# Patient Record
Sex: Female | Born: 1985 | Race: White | Hispanic: No | Marital: Married | Smoking: Never smoker
Health system: Southern US, Community
[De-identification: ages and names within clinical notes are randomized; demographics above are authoritative.]

## PROBLEM LIST (undated history)

## (undated) ENCOUNTER — Inpatient Hospital Stay (HOSPITAL_COMMUNITY): Payer: Self-pay

## (undated) DIAGNOSIS — I10 Essential (primary) hypertension: Secondary | ICD-10-CM

## (undated) HISTORY — PX: TONSILLECTOMY: SUR1361

---
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
## 2005-12-11 ENCOUNTER — Other Ambulatory Visit: Admission: RE | Admit: 2005-12-11 | Discharge: 2005-12-11 | Payer: Self-pay | Admitting: Family Medicine

## 2008-01-01 ENCOUNTER — Emergency Department (HOSPITAL_COMMUNITY): Admission: EM | Admit: 2008-01-01 | Discharge: 2008-01-01 | Payer: Self-pay | Admitting: Emergency Medicine

## 2008-11-24 ENCOUNTER — Inpatient Hospital Stay (HOSPITAL_COMMUNITY): Admission: AD | Admit: 2008-11-24 | Discharge: 2008-11-24 | Payer: Self-pay | Admitting: Obstetrics & Gynecology

## 2008-12-20 ENCOUNTER — Other Ambulatory Visit: Admission: RE | Admit: 2008-12-20 | Discharge: 2008-12-20 | Payer: Self-pay | Admitting: Obstetrics and Gynecology

## 2009-02-22 ENCOUNTER — Ambulatory Visit (HOSPITAL_COMMUNITY): Admission: RE | Admit: 2009-02-22 | Discharge: 2009-02-22 | Payer: Self-pay | Admitting: Obstetrics & Gynecology

## 2009-04-05 ENCOUNTER — Ambulatory Visit (HOSPITAL_COMMUNITY): Admission: RE | Admit: 2009-04-05 | Discharge: 2009-04-05 | Payer: Self-pay | Admitting: Obstetrics & Gynecology

## 2009-06-30 ENCOUNTER — Inpatient Hospital Stay (HOSPITAL_COMMUNITY): Admission: AD | Admit: 2009-06-30 | Discharge: 2009-06-30 | Payer: Self-pay | Admitting: Obstetrics & Gynecology

## 2009-07-06 ENCOUNTER — Ambulatory Visit: Payer: Self-pay | Admitting: Advanced Practice Midwife

## 2009-07-06 ENCOUNTER — Inpatient Hospital Stay (HOSPITAL_COMMUNITY): Admission: AD | Admit: 2009-07-06 | Discharge: 2009-07-06 | Payer: Self-pay | Admitting: Obstetrics and Gynecology

## 2009-07-15 ENCOUNTER — Inpatient Hospital Stay (HOSPITAL_COMMUNITY): Admission: AD | Admit: 2009-07-15 | Discharge: 2009-07-17 | Payer: Self-pay | Admitting: Obstetrics & Gynecology

## 2009-07-15 ENCOUNTER — Ambulatory Visit: Payer: Self-pay | Admitting: Obstetrics and Gynecology

## 2009-09-14 IMAGING — US US OB COMP LESS 14 WK
1 series · 14 of 17 positions shown · non-contrast
Comparison: None

CLINICAL DATA: 7 weeks pregnant and bleeding

OBSTETRIC <14 WK US AND TRANSVAGINAL OB US
TECHNIQUE: Both transabdominal and transvaginal ultrasound
examinations were performed for complete evaluation of the
gestation as well as the maternal uterus, adnexal regions, and
pelvic cul-de-sac.

[Series 1: us ob comp less 14 wks · 17 acquisitions, 14 frames shown]
[im 1/17]
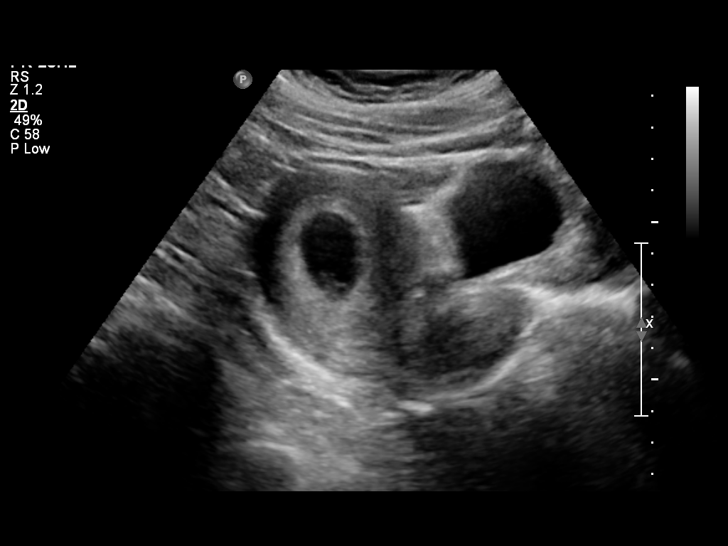
[im 2/17]
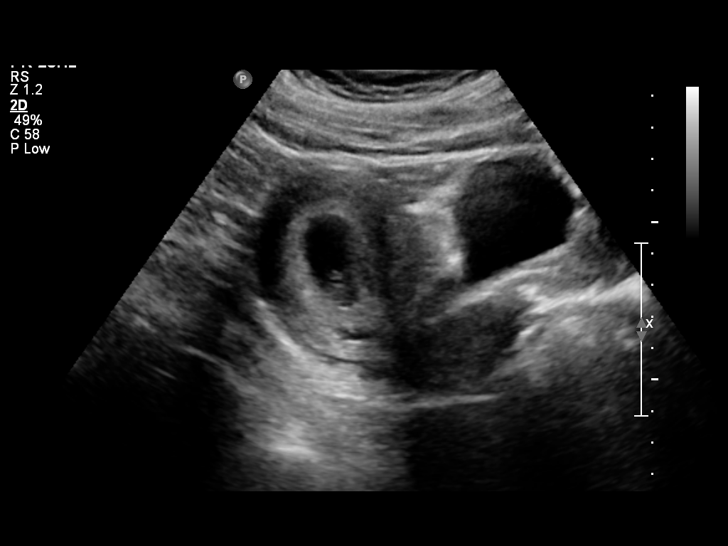
[im 4/17]
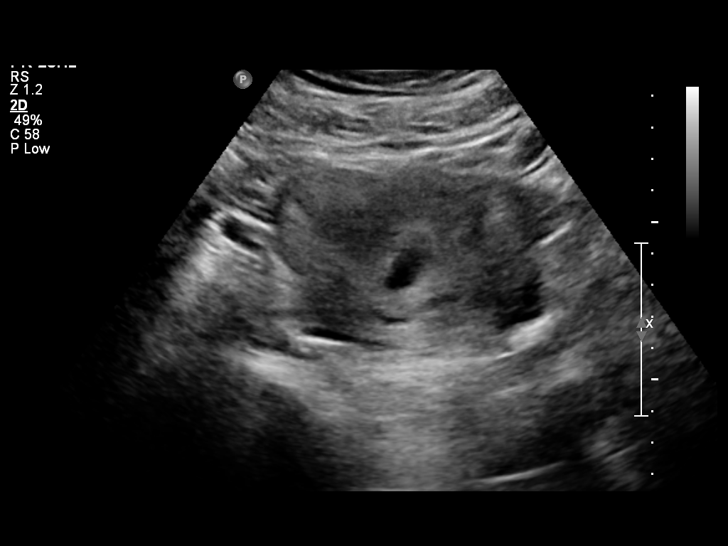
[im 5/17]
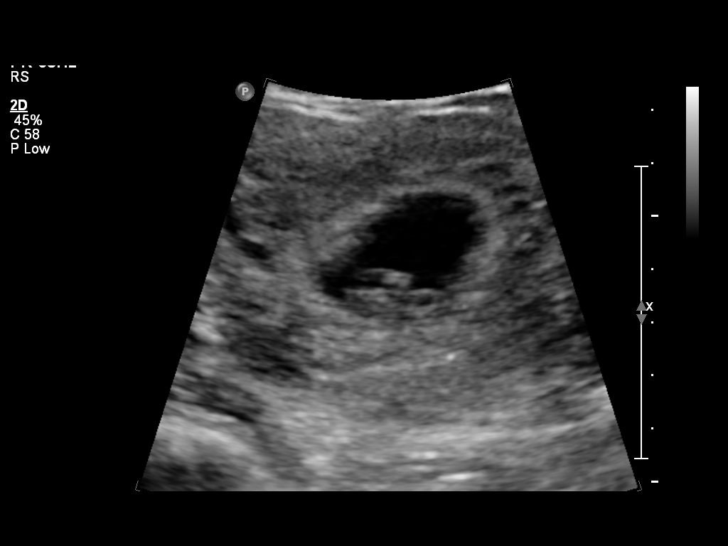
[im 6/17]
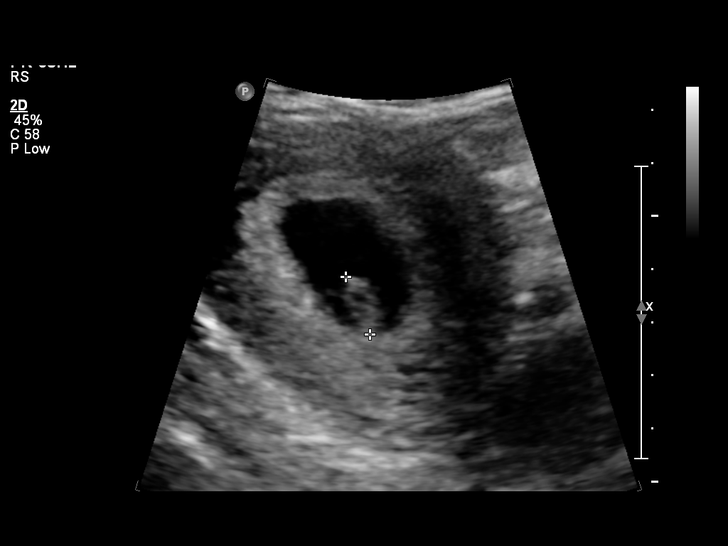
[im 7/17]
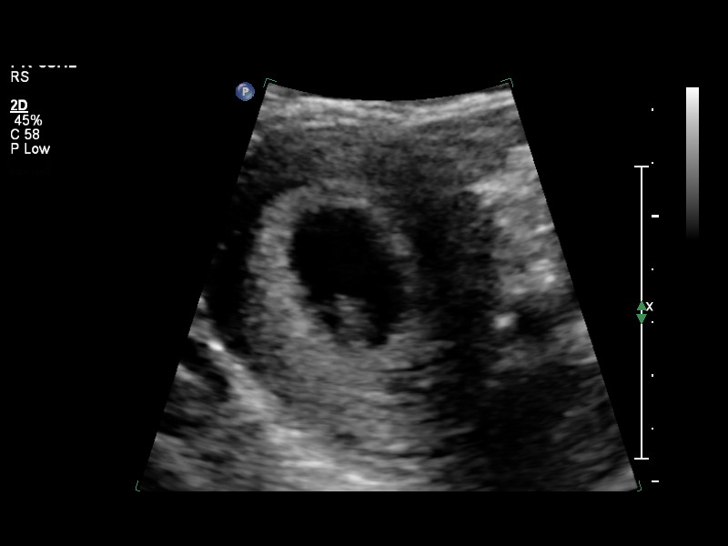
[im 8/17]
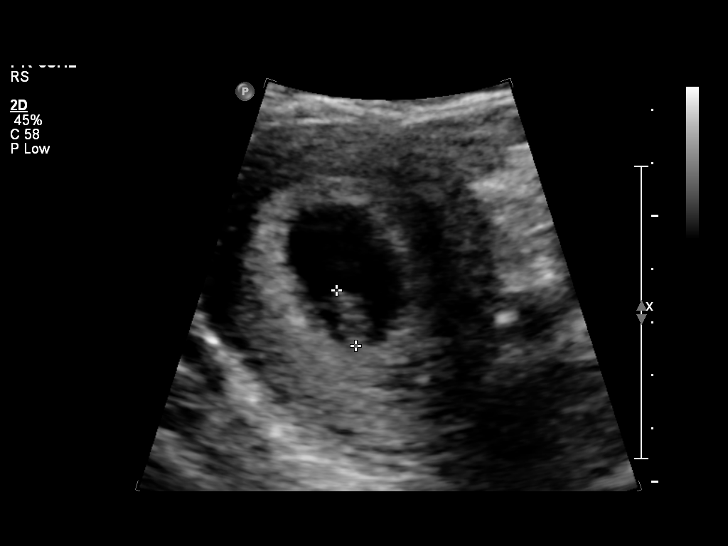
[im 10/17]
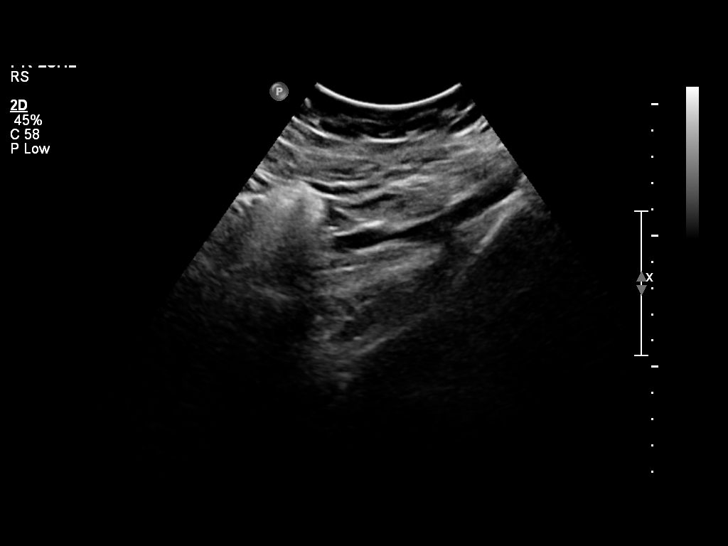
[im 11/17]
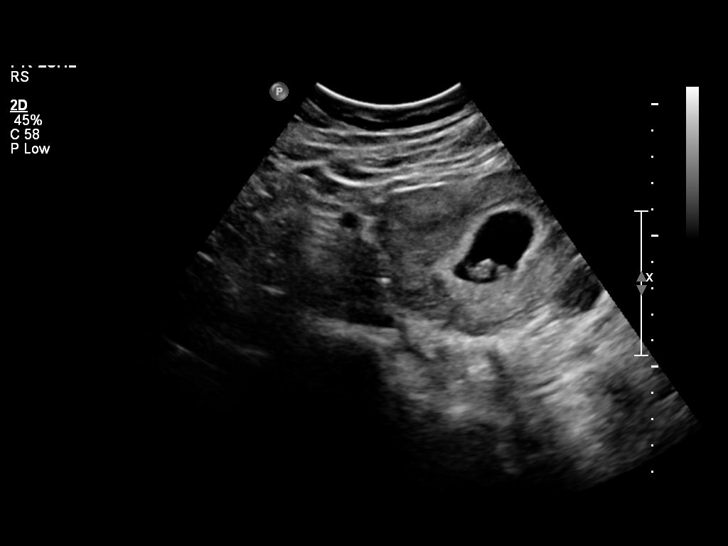
[im 12/17]
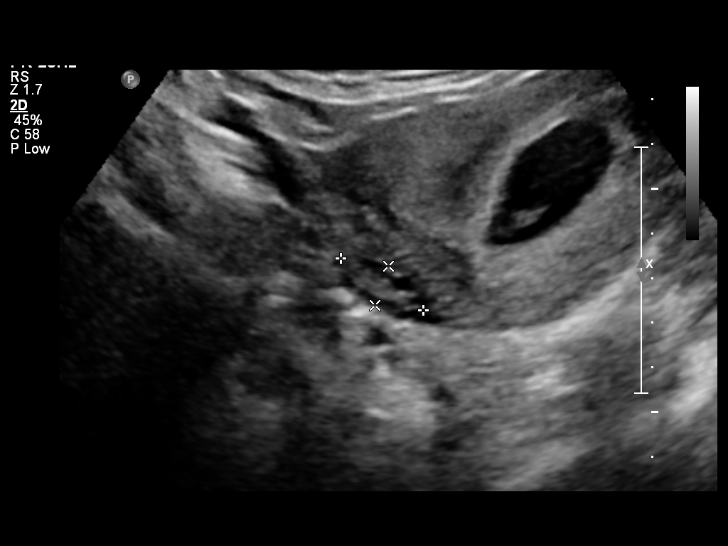
[im 13/17]
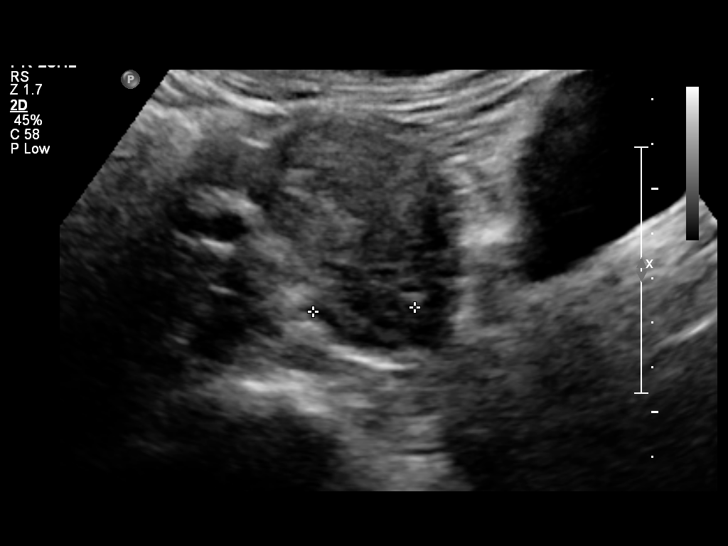
[im 14/17]
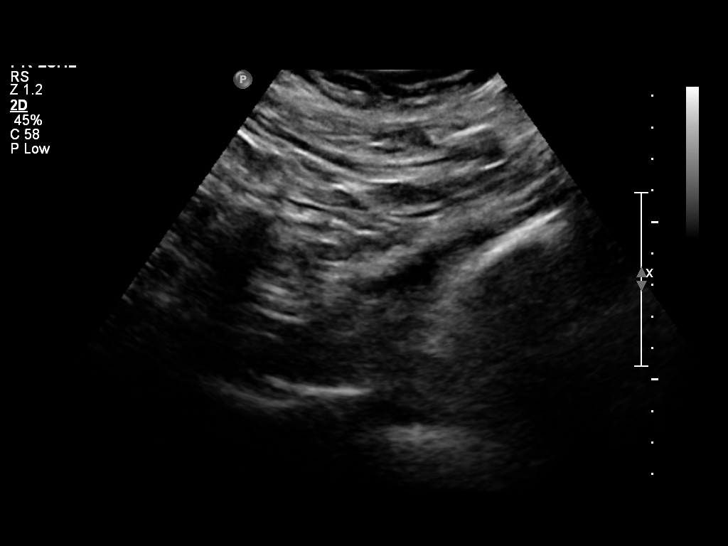
[im 16/17]
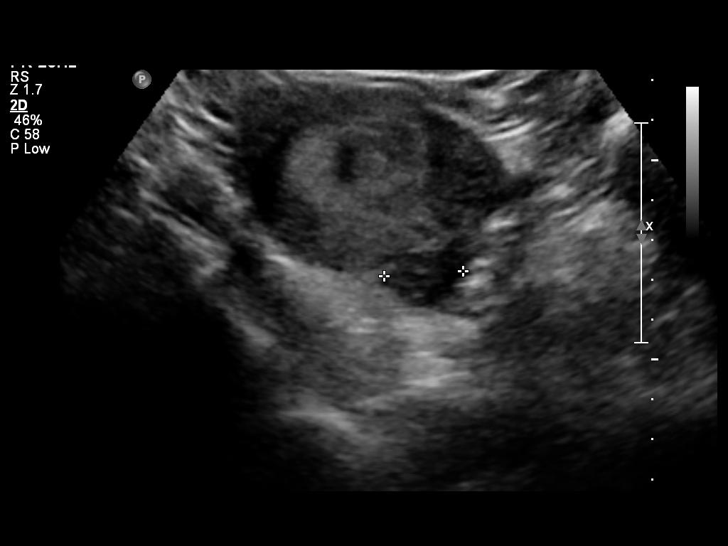
[im 17/17]
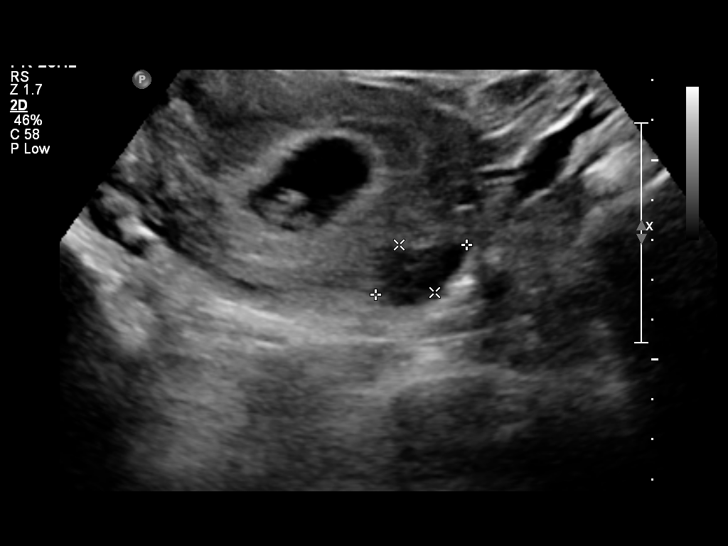

[14 of 17 positions shown; findings below may reference images not displayed]

Intrauterine gestational sac: Present
Yolk sac: Present
Embryo: Present
Cardiac Activity: Present
Heart Rate: 147 bpm

CRL: 11.5 mm           seven   w  three   d           US EDC:

Maternal uterus/adnexae:
Ovaries are within normal limits.  Small subchorionic hemorrhages
present.
IMPRESSION: Live intrauterine pregnancy with an estimated gestational age of 7
weeks and 3 days.  Small subchorionic hemorrhage.  Fetal heart rate
is 147 beats per minute.

## 2010-11-11 LAB — CBC
HCT: 38.2 % (ref 36.0–46.0)
Hemoglobin: 12.9 g/dL (ref 12.0–15.0)
MCHC: 33.8 g/dL (ref 30.0–36.0)
MCV: 90.6 fL (ref 78.0–100.0)
Platelets: 275 10*3/uL (ref 150–400)
RBC: 4.22 MIL/uL (ref 3.87–5.11)
RDW: 13.4 % (ref 11.5–15.5)
WBC: 18.6 10*3/uL — ABNORMAL HIGH (ref 4.0–10.5)

## 2010-11-11 LAB — RPR: RPR Ser Ql: NONREACTIVE

## 2010-11-12 LAB — WET PREP, GENITAL
Clue Cells Wet Prep HPF POC: NONE SEEN
Trich, Wet Prep: NONE SEEN
Yeast Wet Prep HPF POC: NONE SEEN

## 2010-11-19 LAB — CBC
HCT: 34.9 % — ABNORMAL LOW (ref 36.0–46.0)
Hemoglobin: 12.1 g/dL (ref 12.0–15.0)
MCHC: 34.6 g/dL (ref 30.0–36.0)
MCV: 91.8 fL (ref 78.0–100.0)
Platelets: 221 10*3/uL (ref 150–400)
RBC: 3.8 MIL/uL — ABNORMAL LOW (ref 3.87–5.11)
RDW: 13.1 % (ref 11.5–15.5)
WBC: 6.2 10*3/uL (ref 4.0–10.5)

## 2010-11-19 LAB — GC/CHLAMYDIA PROBE AMP, GENITAL
Chlamydia, DNA Probe: NEGATIVE
GC Probe Amp, Genital: NEGATIVE

## 2010-11-19 LAB — SAMPLE TO BLOOD BANK

## 2010-11-19 LAB — WET PREP, GENITAL
Clue Cells Wet Prep HPF POC: NONE SEEN
Trich, Wet Prep: NONE SEEN
Yeast Wet Prep HPF POC: NONE SEEN

## 2010-11-19 LAB — HCG, QUANTITATIVE, PREGNANCY: hCG, Beta Chain, Quant, S: 96209 m[IU]/mL — ABNORMAL HIGH (ref <5)

## 2011-05-06 LAB — URINALYSIS, ROUTINE W REFLEX MICROSCOPIC
Bilirubin Urine: NEGATIVE
Glucose, UA: NEGATIVE
Ketones, ur: NEGATIVE
Nitrite: NEGATIVE
Protein, ur: 30 — AB
Specific Gravity, Urine: 1.02
Urobilinogen, UA: 4 — ABNORMAL HIGH
pH: 6.5

## 2011-05-06 LAB — URINE MICROSCOPIC-ADD ON

## 2011-05-06 LAB — PREGNANCY, URINE: Preg Test, Ur: NEGATIVE

## 2011-12-09 ENCOUNTER — Ambulatory Visit: Payer: Self-pay | Admitting: Physical Therapy

## 2012-12-12 ENCOUNTER — Encounter: Payer: Self-pay | Admitting: *Deleted

## 2012-12-12 DIAGNOSIS — Z87442 Personal history of urinary calculi: Secondary | ICD-10-CM

## 2012-12-13 ENCOUNTER — Encounter: Payer: Self-pay | Admitting: Adult Health

## 2012-12-13 ENCOUNTER — Encounter: Payer: Self-pay | Admitting: *Deleted

## 2012-12-30 ENCOUNTER — Other Ambulatory Visit: Payer: Self-pay | Admitting: Adult Health

## 2014-06-11 ENCOUNTER — Encounter: Payer: Self-pay | Admitting: *Deleted

## 2015-10-23 ENCOUNTER — Encounter: Payer: BLUE CROSS/BLUE SHIELD | Admitting: Adult Health

## 2015-10-29 ENCOUNTER — Ambulatory Visit (INDEPENDENT_AMBULATORY_CARE_PROVIDER_SITE_OTHER): Payer: BLUE CROSS/BLUE SHIELD | Admitting: Women's Health

## 2015-10-29 ENCOUNTER — Encounter: Payer: Self-pay | Admitting: Women's Health

## 2015-10-29 VITALS — BP 130/94 | HR 80 | Wt 301.0 lb

## 2015-10-29 DIAGNOSIS — Z3046 Encounter for surveillance of implantable subdermal contraceptive: Secondary | ICD-10-CM

## 2015-10-29 DIAGNOSIS — Z3202 Encounter for pregnancy test, result negative: Secondary | ICD-10-CM

## 2015-10-29 LAB — POCT URINE PREGNANCY: Preg Test, Ur: NEGATIVE

## 2015-10-29 NOTE — Patient Instructions (Addendum)
Begin a prenatal vitamin daily  Keep the area clean and dry.  You can remove the big bandage in 24 hours, and the small steri-strip bandage in 3-5 days.

## 2015-10-29 NOTE — Addendum Note (Signed)
Addended by: Gaylyn RongEVANS, Keano Guggenheim A on: 10/29/2015 02:24 PM   Modules accepted: Orders

## 2015-10-29 NOTE — Progress Notes (Signed)
Patient ID: Mahalia LongestBrandy N Nevins, female   DOB: 06/03/1986, 30 y.o.   MRN: [id number] Mahalia LongestBrandy N Riches is a 30 y.o. year old Caucasian female here for Nexplanon removal.  Patient given informed consent for removal of her Nexplanon. Her Nexplanon was placed 09/23/09, so has been in for >2146yrs now. Is wanting to get pregnant. Denies h/o HTN. Not taking pnv- to go ahead and start. Last pap >1570yrs ago.   BP 130/94 mmHg  Pulse 80  Wt 301 lb (136.533 kg)  Appropriate time out taken. Nexplanon site identified.  Area prepped in usual sterile fashon. One cc of 2% lidocaine was used to anesthetize the area at the distal end of the implant. A small stab incision was made right beside the implant on the distal portion.  The Nexplanon rod was grasped using hemostats and removed without difficulty.  There was less than 3 cc blood loss. There were no complications.  Steri-strips were applied over the small incision and a pressure bandage was applied.  The patient tolerated the procedure well.  She was instructed to keep the area clean and dry, remove pressure bandage in 24 hours, and keep insertion site covered with the steri-strip for 3-5 days.    Follow-up PRN problems.  Marge DuncansBooker, Arvle Grabe Randall CNM, [facility] 10/29/2015 2:18 PM

## 2015-11-13 ENCOUNTER — Ambulatory Visit (INDEPENDENT_AMBULATORY_CARE_PROVIDER_SITE_OTHER): Payer: BLUE CROSS/BLUE SHIELD | Admitting: Women's Health

## 2015-11-13 ENCOUNTER — Encounter: Payer: Self-pay | Admitting: Women's Health

## 2015-11-13 ENCOUNTER — Other Ambulatory Visit (HOSPITAL_COMMUNITY)
Admission: RE | Admit: 2015-11-13 | Discharge: 2015-11-13 | Disposition: A | Discharge status: Home / Self Care | Payer: BLUE CROSS/BLUE SHIELD | Source: Ambulatory Visit | Attending: Obstetrics & Gynecology | Admitting: Obstetrics & Gynecology

## 2015-11-13 VITALS — BP 118/82 | HR 68 | Ht 62.0 in | Wt 302.0 lb

## 2015-11-13 DIAGNOSIS — Z1151 Encounter for screening for human papillomavirus (HPV): Secondary | ICD-10-CM | POA: Diagnosis present

## 2015-11-13 DIAGNOSIS — Z01419 Encounter for gynecological examination (general) (routine) without abnormal findings: Secondary | ICD-10-CM | POA: Diagnosis present

## 2015-11-13 DIAGNOSIS — N393 Stress incontinence (female) (male): Secondary | ICD-10-CM | POA: Insufficient documentation

## 2015-11-13 DIAGNOSIS — E669 Obesity, unspecified: Secondary | ICD-10-CM | POA: Insufficient documentation

## 2015-11-13 NOTE — Progress Notes (Signed)
Patient ID: Kari LongestBrandy N Doxtater, female   DOB: 07/15/1986, 30 y.o.   MRN: [id number] Subjective:   Kari Maddox is a 30 y.o. 881P1001 Caucasian female here for a routine well-woman exam.  Patient's last menstrual period was 10/16/2015.    Current complaints: loses urine w/ cough/sneeze, etc  [facility]       Does desire labs but has not been fasting and [place] in [place], will get w/ PCP  Social History: Sexual: heterosexual Marital Status: married Living situation: with family Occupation: Adult nursecustomer service rep [employer] Tobacco/alcohol: no tobacco or etoh Illicit drugs: no history of illicit drug use  The following portions of the patient's history were reviewed and updated as appropriate: allergies, current medications, past family history, past medical history, past social history, past surgical history and problem list.  Past Medical History Past Medical History  Diagnosis Date  . Kidney stones     Past Surgical History Past Surgical History  Procedure Laterality Date  . Tonsillectomy      Gynecologic History G1P1001  Patient's last menstrual period was 10/16/2015. Contraception: none, just had nexplanon removed, 'if pregnancy happens it happens' Last Pap: 2010. Results were: normal Last mammogram: never. Results were: n/a Last TCS: never  Obstetric History OB History  Gravida Para Term Preterm AB SAB TAB Ectopic Multiple Living  1 1 1       1     # Outcome Date GA Lbr Len/2nd Weight Sex Delivery Anes PTL Lv  1 Term 07/16/09 521w6d  7 lb 6 oz (3.345 kg) F Vag-Spont   Y      Current Medications Current Outpatient Prescriptions on File Prior to Visit  Medication Sig Dispense Refill  . etonogestrel (IMPLANON) 68 MG IMPL implant Inject 1 each into the skin once. Reported on 11/13/2015     No current facility-administered medications on file prior to visit.    Review of Systems Patient denies any headaches, blurred vision, shortness of breath, chest  pain, abdominal pain, problems with bowel movements, urination, or intercourse.  Objective:  BP 118/82 mmHg  Pulse 68  Ht 5\' 2"  (1.575 m)  Wt 302 lb (136.986 kg)  BMI 55.22 kg/m2  LMP 10/16/2015 Physical Exam  General:  Well developed, well nourished, no acute distress. She is alert and oriented x3. Skin:  Warm and dry Neck:  Midline trachea, no thyromegaly or nodules Cardiovascular: Regular rate and rhythm, no murmur heard Lungs:  Effort normal, all lung fields clear to auscultation bilaterally Breasts:  No dominant palpable mass, retraction, or nipple discharge Abdomen:  Soft, non tender, no hepatosplenomegaly or masses Pelvic:  External genitalia is normal in appearance.  The vagina is normal in appearance. The cervix is bulbous, no CMT.  Thin prep pap is done w/  HR HPV cotesting. Uterus is felt to be normal size, shape, and contour.  No adnexal masses or tenderness noted. Extremities:  No swelling or varicosities noted Psych:  She has a normal mood and affect  Assessment:   Healthy well-woman exam Stress urinary incontinence Obesity  Plan:  Kegels 100/day Lose weight, decrease carbs and increase exercise Begin taking pnv daily Labs w/ PCP F/U 2286yr for physical, or sooner if needed Mammogram @30yo  or sooner if problems Colonoscopy @30yo  or sooner if problems  Marge DuncansBooker, Rakel Junio Randall CNM, WHNP-BC 11/13/2015 11:03 AM

## 2015-11-13 NOTE — Patient Instructions (Addendum)
Decrease carbohydrates (bread, rice, pasta, potatoes, sweets, sodas), Increase exercise  Kegel Exercises (10 reps 10 times a day=100/day) The goal of Kegel exercises is to isolate and exercise your pelvic floor muscles. These muscles act as a hammock that supports the rectum, vagina, small intestine, and uterus. As the muscles weaken, the hammock sags and these organs are displaced from their normal positions. Kegel exercises can strengthen your pelvic floor muscles and help you to improve bladder and bowel control, improve sexual response, and help reduce many problems and some discomfort during pregnancy. Kegel exercises can be done anywhere and at any time. HOW TO PERFORM KEGEL EXERCISES 1. Locate your pelvic floor muscles. To do this, squeeze (contract) the muscles that you use when you try to stop the flow of urine. You will feel a tightness in the vaginal area (women) and a tight lift in the rectal area (men and women). 2. When you begin, contract your pelvic muscles tight for 2-5 seconds, then relax them for 2-5 seconds. This is one set. Do 4-5 sets with a short pause in between. 3. Contract your pelvic muscles for 8-10 seconds, then relax them for 8-10 seconds. Do 4-5 sets. If you cannot contract your pelvic muscles for 8-10 seconds, try 5-7 seconds and work your way up to 8-10 seconds. Your goal is 4-5 sets of 10 contractions each day. Keep your stomach, buttocks, and legs relaxed during the exercises. Perform sets of both short and long contractions. Vary your positions. Perform these contractions 3-4 times per day. Perform sets while you are:   Lying in bed in the morning.  Standing at lunch.  Sitting in the late afternoon.  Lying in bed at night. You should do 40-50 contractions per day. Do not perform more Kegel exercises per day than recommended. Overexercising can cause muscle fatigue. Continue these exercises for for at least 15-20 weeks or as directed by your caregiver.   This  information is not intended to replace advice given to you by your health care provider. Make sure you discuss any questions you have with your health care provider.   Document Released: 07/13/2012 Document Revised: 08/17/2014 Document Reviewed: 07/13/2012 Elsevier Interactive Patient Education [employer]2016 Elsevier Inc.

## 2015-11-14 LAB — CYTOLOGY - PAP

## 2016-05-27 ENCOUNTER — Ambulatory Visit (INDEPENDENT_AMBULATORY_CARE_PROVIDER_SITE_OTHER): Payer: BLUE CROSS/BLUE SHIELD | Admitting: Women's Health

## 2016-05-27 ENCOUNTER — Encounter: Payer: Self-pay | Admitting: Women's Health

## 2016-05-27 VITALS — BP 140/90 | HR 68 | Wt 294.0 lb

## 2016-05-27 DIAGNOSIS — N926 Irregular menstruation, unspecified: Secondary | ICD-10-CM | POA: Diagnosis not present

## 2016-05-27 DIAGNOSIS — O3680X0 Pregnancy with inconclusive fetal viability, not applicable or unspecified: Secondary | ICD-10-CM

## 2016-05-27 DIAGNOSIS — Z3201 Encounter for pregnancy test, result positive: Secondary | ICD-10-CM | POA: Diagnosis not present

## 2016-05-27 DIAGNOSIS — Z3A01 Less than 8 weeks gestation of pregnancy: Secondary | ICD-10-CM

## 2016-05-27 LAB — POCT URINE PREGNANCY: Preg Test, Ur: POSITIVE — AB

## 2016-05-27 NOTE — Patient Instructions (Signed)

## 2016-05-27 NOTE — Progress Notes (Signed)
   [facility] Visit  Patient name: Kari Maddox MRN [id number]  Date of birth: 01/19/1986  CC & HPI:  Kari Maddox is a 30 y.o. 611P1001 Caucasian female at 467.5wks by fairly certain LMP, presenting today for +PT. +nausea, no vomiting- declines meds. Not taking pnv yet. Last pregnancy progesterone was low and had to take suppositories. No h/o HTN.  Patient's last menstrual period was 04/03/2016.  Pertinent History Reviewed:  Medical & Surgical Hx:   Past medical, surgical, family, and social history reviewed in electronic medical record Medications: Reviewed & Updated - see associated section Allergies: Reviewed in electronic medical record  Objective Findings:  Vitals: BP 140/90 (BP Location: Left Arm, Patient Position: Sitting, Cuff Size: Large)   Pulse 68   Wt 294 lb (133.4 kg)   LMP 04/03/2016   BMI 53.77 kg/m  Body mass index is 53.77 kg/m.  Physical Examination: General appearance - alert, well appearing, and in no distress  Results for orders placed or performed in visit on 05/27/16 (from the past 24 hour(s))  POCT urine pregnancy   Collection Time: 05/27/16  4:06 PM  Result Value Ref Range   Preg Test, Ur Positive (A) Negative     Assessment & Plan:  A:   7129w5d by LMP  Nausea of pregnancy  H/O low progesterone   BP elevated today, no h/o HTN  P:  Begin pnv daily today  Gave printed info on n/v, let us know if needs meds  BHCG, progesterone today  Return in about 1 week (around 06/03/2016) for for dating u/s, then 2wks for new ob visit.  Marge DuncansBooker, Kimberly Randall CNM, [facility] 05/27/2016 4:43 PM

## 2016-05-28 ENCOUNTER — Telehealth: Payer: Self-pay | Admitting: Women's Health

## 2016-05-28 DIAGNOSIS — R7989 Other specified abnormal findings of blood chemistry: Secondary | ICD-10-CM

## 2016-05-28 LAB — PROGESTERONE: Progesterone: 11.7 ng/mL

## 2016-05-28 LAB — BETA HCG QUANT (REF LAB): hCG Quant: 111290 m[IU]/mL

## 2016-05-28 MED ORDER — PROGESTERONE MICRONIZED 200 MG PO CAPS: 200.0000 mg | ORAL_CAPSULE | Freq: Every day | ORAL | 5 refills | Status: DC

## 2016-05-28 NOTE — Telephone Encounter (Signed)
Notified progesterone slightly low, rx prometrium 200mg  pv qhs until 14wks.  Cheral MarkerKimberly R. Kacy Hegna, CNM, WHNP-BC 05/28/2016 1:32 PM

## 2016-06-03 ENCOUNTER — Ambulatory Visit (INDEPENDENT_AMBULATORY_CARE_PROVIDER_SITE_OTHER): Payer: BLUE CROSS/BLUE SHIELD

## 2016-06-03 DIAGNOSIS — O3491 Maternal care for abnormality of pelvic organ, unspecified, first trimester: Secondary | ICD-10-CM

## 2016-06-03 DIAGNOSIS — O3680X0 Pregnancy with inconclusive fetal viability, not applicable or unspecified: Secondary | ICD-10-CM | POA: Diagnosis not present

## 2016-06-03 DIAGNOSIS — Z3A10 10 weeks gestation of pregnancy: Secondary | ICD-10-CM

## 2016-06-03 NOTE — Progress Notes (Signed)
US 10 wks,single IUP pos fht 167 bpm,normal lt ov, simple corpus luteal cyst right 3.3 x 2.4 x 2.5 cm,crl 33.2 mm

## 2016-06-10 ENCOUNTER — Ambulatory Visit (INDEPENDENT_AMBULATORY_CARE_PROVIDER_SITE_OTHER): Payer: BLUE CROSS/BLUE SHIELD | Admitting: Women's Health

## 2016-06-10 ENCOUNTER — Encounter: Payer: Self-pay | Admitting: Women's Health

## 2016-06-10 VITALS — BP 136/88 | HR 68 | Wt 291.0 lb

## 2016-06-10 DIAGNOSIS — Z3490 Encounter for supervision of normal pregnancy, unspecified, unspecified trimester: Secondary | ICD-10-CM | POA: Insufficient documentation

## 2016-06-10 DIAGNOSIS — Z1389 Encounter for screening for other disorder: Secondary | ICD-10-CM

## 2016-06-10 DIAGNOSIS — Z3481 Encounter for supervision of other normal pregnancy, first trimester: Secondary | ICD-10-CM

## 2016-06-10 DIAGNOSIS — Z3682 Encounter for antenatal screening for nuchal translucency: Secondary | ICD-10-CM

## 2016-06-10 DIAGNOSIS — Z331 Pregnant state, incidental: Secondary | ICD-10-CM

## 2016-06-10 DIAGNOSIS — O99211 Obesity complicating pregnancy, first trimester: Secondary | ICD-10-CM

## 2016-06-10 DIAGNOSIS — Z6841 Body Mass Index (BMI) 40.0 and over, adult: Secondary | ICD-10-CM

## 2016-06-10 DIAGNOSIS — Z3A11 11 weeks gestation of pregnancy: Secondary | ICD-10-CM

## 2016-06-10 DIAGNOSIS — Z0283 Encounter for blood-alcohol and blood-drug test: Secondary | ICD-10-CM

## 2016-06-10 LAB — POCT URINALYSIS DIPSTICK
Blood, UA: NEGATIVE
Glucose, UA: NEGATIVE
Ketones, UA: NEGATIVE
Leukocytes, UA: NEGATIVE
Nitrite, UA: NEGATIVE
Protein, UA: NEGATIVE

## 2016-06-10 MED ORDER — DOXYLAMINE-PYRIDOXINE 10-10 MG PO TBEC: DELAYED_RELEASE_TABLET | ORAL | 6 refills | Status: DC

## 2016-06-10 NOTE — Progress Notes (Signed)
  Subjective:  Mahalia LongestBrandy N Carboni is a 30 y.o. 242P1001 Caucasian female at 7260w0d by 10wk u/s, being seen today for her first obstetrical visit.  Her obstetrical history is significant for term uncomplicated svb x 1, low progesterone- taking prometrium pv nightly, obesity.  Pregnancy history fully reviewed.  Patient reports nausea- requests meds. Denies vb, cramping, uti s/s, abnormal/malodorous vag d/c, or vulvovaginal itching/irritation.  BP 136/88   Pulse 68   Wt 291 lb (132 kg)   LMP 04/03/2016 (Approximate)   BMI 53.22 kg/m   HISTORY: OB History  Gravida Para Term Preterm AB Living  2 1 1     1   SAB TAB Ectopic Multiple Live Births          1    # Outcome Date GA Lbr Len/2nd Weight Sex Delivery Anes PTL Lv  2 Current           1 Term 07/16/09 7741w6d  7 lb 6 oz (3.345 kg) F Vag-Spont EPI N LIV     Past Medical History:  Diagnosis Date  . Medical history non-contributory    Past Surgical History:  Procedure Laterality Date  . TONSILLECTOMY     Family History  Problem Relation Age of Onset  . Cancer Maternal Grandmother     colon  . Diabetes Maternal Grandmother   . Kidney disease Maternal Grandmother   . Hypertension Father   . Hypertension Mother   . Diabetes Mother   . Kidney disease Mother     Exam   System:     General: Well developed & nourished, no acute distress   Skin: Warm & dry, normal coloration and turgor, no rashes   Neurologic: Alert & oriented, normal mood   Cardiovascular: Regular rate & rhythm   Respiratory: Effort & rate normal, LCTAB, acyanotic   Abdomen: Soft, non tender   Extremities: normal strength, tone  Thin prep pap smear neg 11/2015  FHR: 161 via informal u/s   Assessment:   Pregnancy: G2P1001 Patient Active Problem List   Diagnosis Date Noted  . Supervision of normal pregnancy 06/10/2016  . Low serum progesterone 05/28/2016  . SUI (stress urinary incontinence, female) 11/13/2015  . Obesity 11/13/2015  . History of kidney stones  12/12/2012    6760w0d G2P1001 New OB visit Nausea Obesity  Plan:  Initial labs drawn Continue prenatal vitamins Problem list reviewed and updated Reviewed n/v relief measures and warning s/s to report Rx diclegis, let us know if any problems getting w/ insurance- will rx phenergan Reviewed recommended weight gain based on pre-gravid BMI Encouraged well-balanced diet Genetic Screening discussed Integrated Screen: requested Cystic fibrosis screening discussed declined Ultrasound discussed; fetal survey: requested Follow up in 1 weeks for 1st nt/it (no visit), then 4wks for visit CCNC completed Signed up for babyscripts (under Shawnie PonsPratt, we were not listed)  Marge DuncansBooker, Keiji Melland Randall CNM, WHNP-BC 06/10/2016 9:25 AM

## 2016-06-10 NOTE — Patient Instructions (Signed)

## 2016-06-11 LAB — PMP SCREEN PROFILE (10S), URINE
Amphetamine Screen, Ur: NEGATIVE ng/mL
Barbiturate Screen, Ur: NEGATIVE ng/mL
Benzodiazepine Screen, Urine: NEGATIVE ng/mL
Cannabinoids Ur Ql Scn: NEGATIVE ng/mL
Cocaine(Metab.)Screen, Urine: NEGATIVE ng/mL
Creatinine(Crt), U: 207.1 mg/dL (ref 20.0–300.0)
Methadone Scn, Ur: NEGATIVE ng/mL
Opiate Scrn, Ur: NEGATIVE ng/mL
Oxycodone+Oxymorphone Ur Ql Scn: NEGATIVE ng/mL
PCP Scrn, Ur: NEGATIVE ng/mL
Ph of Urine: 5.7 (ref 4.5–8.9)
Propoxyphene, Screen: NEGATIVE ng/mL

## 2016-06-11 LAB — ABO/RH: Rh Factor: POSITIVE

## 2016-06-11 LAB — URINALYSIS, ROUTINE W REFLEX MICROSCOPIC
Bilirubin, UA: NEGATIVE
Glucose, UA: NEGATIVE
Ketones, UA: NEGATIVE
Leukocytes, UA: NEGATIVE
Nitrite, UA: NEGATIVE
Protein, UA: NEGATIVE
RBC, UA: NEGATIVE
Specific Gravity, UA: 1.026 (ref 1.005–1.030)
Urobilinogen, Ur: 1 mg/dL (ref 0.2–1.0)
pH, UA: 6 (ref 5.0–7.5)

## 2016-06-11 LAB — GC/CHLAMYDIA PROBE AMP
Chlamydia trachomatis, NAA: NEGATIVE
Neisseria gonorrhoeae by PCR: NEGATIVE

## 2016-06-11 LAB — CBC
Hematocrit: 37.9 % (ref 34.0–46.6)
Hemoglobin: 12.9 g/dL (ref 11.1–15.9)
MCH: 29.7 pg (ref 26.6–33.0)
MCHC: 34 g/dL (ref 31.5–35.7)
MCV: 87 fL (ref 79–97)
Platelets: 204 10*3/uL (ref 150–379)
RBC: 4.35 x10E6/uL (ref 3.77–5.28)
RDW: 14.4 % (ref 12.3–15.4)
WBC: 6.2 10*3/uL (ref 3.4–10.8)

## 2016-06-11 LAB — HEPATITIS B SURFACE ANTIGEN: Hepatitis B Surface Ag: NEGATIVE

## 2016-06-11 LAB — RUBELLA SCREEN: Rubella Antibodies, IGG: 18.7 index (ref >0.99)

## 2016-06-11 LAB — ANTIBODY SCREEN: Antibody Screen: NEGATIVE

## 2016-06-11 LAB — HIV ANTIBODY (ROUTINE TESTING W REFLEX): HIV Screen 4th Generation wRfx: NONREACTIVE

## 2016-06-11 LAB — VARICELLA ZOSTER ANTIBODY, IGG: Varicella zoster IgG: 1599 index (ref >165)

## 2016-06-11 LAB — RPR: RPR Ser Ql: NONREACTIVE

## 2016-06-12 LAB — URINE CULTURE

## 2016-06-15 ENCOUNTER — Telehealth: Payer: Self-pay | Admitting: *Deleted

## 2016-06-15 NOTE — Telephone Encounter (Signed)
I called [facility] to see if PA was approved and Diclegis did go through. Pharmacy filled prescription and notified pt. JSY

## 2016-06-16 ENCOUNTER — Ambulatory Visit (INDEPENDENT_AMBULATORY_CARE_PROVIDER_SITE_OTHER): Payer: BLUE CROSS/BLUE SHIELD

## 2016-06-16 ENCOUNTER — Other Ambulatory Visit: Payer: Self-pay | Admitting: Obstetrics & Gynecology

## 2016-06-16 ENCOUNTER — Other Ambulatory Visit: Payer: BLUE CROSS/BLUE SHIELD

## 2016-06-16 DIAGNOSIS — Z3682 Encounter for antenatal screening for nuchal translucency: Secondary | ICD-10-CM | POA: Diagnosis not present

## 2016-06-16 DIAGNOSIS — Z3A12 12 weeks gestation of pregnancy: Secondary | ICD-10-CM

## 2016-06-16 DIAGNOSIS — O99211 Obesity complicating pregnancy, first trimester: Secondary | ICD-10-CM

## 2016-06-16 DIAGNOSIS — O3491 Maternal care for abnormality of pelvic organ, unspecified, first trimester: Secondary | ICD-10-CM | POA: Diagnosis not present

## 2016-06-16 NOTE — Progress Notes (Signed)
US 11+6 wks,crl 45.4 mm,fhr 163 bpm,normal lt ov,simple right corpus luteal cyst 2.7 x 2.5 x 2.5 cm,unable to visualize NT because of pt body habitus,pt will come back next week for f/u ultrasound.

## 2016-06-24 ENCOUNTER — Other Ambulatory Visit: Payer: BLUE CROSS/BLUE SHIELD

## 2016-06-24 ENCOUNTER — Ambulatory Visit (INDEPENDENT_AMBULATORY_CARE_PROVIDER_SITE_OTHER): Payer: BLUE CROSS/BLUE SHIELD

## 2016-06-24 DIAGNOSIS — O3491 Maternal care for abnormality of pelvic organ, unspecified, first trimester: Secondary | ICD-10-CM

## 2016-06-24 DIAGNOSIS — Z3A13 13 weeks gestation of pregnancy: Secondary | ICD-10-CM | POA: Diagnosis not present

## 2016-06-24 DIAGNOSIS — Z3682 Encounter for antenatal screening for nuchal translucency: Secondary | ICD-10-CM | POA: Diagnosis not present

## 2016-06-24 NOTE — Progress Notes (Signed)
US TA/TV 13 wks,fhr 159 bpm,crl 60.3 mm,post pl gr 0,normal lt ov,simple right corpus luteal cyst 2.5 x 2.7 x 2.3 cm,NB present,NT 1.7 mm,limited ultrasound because of pt body habitus.

## 2016-07-08 ENCOUNTER — Encounter: Payer: Self-pay | Admitting: Advanced Practice Midwife

## 2016-07-08 ENCOUNTER — Ambulatory Visit (INDEPENDENT_AMBULATORY_CARE_PROVIDER_SITE_OTHER): Payer: BLUE CROSS/BLUE SHIELD | Admitting: Advanced Practice Midwife

## 2016-07-08 VITALS — BP 120/80 | HR 74 | Wt 288.0 lb

## 2016-07-08 DIAGNOSIS — Z331 Pregnant state, incidental: Secondary | ICD-10-CM

## 2016-07-08 DIAGNOSIS — Z363 Encounter for antenatal screening for malformations: Secondary | ICD-10-CM

## 2016-07-08 DIAGNOSIS — Z3682 Encounter for antenatal screening for nuchal translucency: Secondary | ICD-10-CM

## 2016-07-08 DIAGNOSIS — Z3A15 15 weeks gestation of pregnancy: Secondary | ICD-10-CM

## 2016-07-08 DIAGNOSIS — Z1389 Encounter for screening for other disorder: Secondary | ICD-10-CM

## 2016-07-08 DIAGNOSIS — Z3482 Encounter for supervision of other normal pregnancy, second trimester: Secondary | ICD-10-CM

## 2016-07-08 LAB — POCT URINALYSIS DIPSTICK
Blood, UA: NEGATIVE
Glucose, UA: NEGATIVE
Leukocytes, UA: NEGATIVE
Nitrite, UA: NEGATIVE

## 2016-07-08 NOTE — Progress Notes (Signed)
G2P1001 2850w0d Estimated Date of Delivery: 12/30/16  Last menstrual period 04/03/2016.   BP weight and urine results all reviewed and noted.  Please refer to the obstetrical flow sheet for the fundal height and fetal heart rate documentation:  Patient denies any bleeding and no rupture of membranes symptoms or regular contractions. Patient is without complaints. All questions were answered.  Orders Placed This Encounter  Procedures  . US OB Comp + 14 Wk    Plan:  Continued routine obstetrical care, too early for 2nd IT (ordered for 2 weeks from now)   Return in about 4 weeks (around 08/05/2016) for LROB, ON:GEXBMWUS:Anatomy.

## 2016-07-08 NOTE — Patient Instructions (Signed)
Second Trimester of Pregnancy The second trimester is from week 13 through week 28 (months 4 through 6). The second trimester is often a time when you feel your best. Your body has also adjusted to being pregnant, and you begin to feel better physically. Usually, morning sickness has lessened or quit completely, you may have more energy, and you may have an increase in appetite. The second trimester is also a time when the fetus is growing rapidly. At the end of the sixth month, the fetus is about 9 inches long and weighs about 1 pounds. You will likely begin to feel the baby move (quickening) between 18 and 20 weeks of the pregnancy. Body changes during your second trimester Your body continues to go through many changes during your second trimester. The changes vary from woman to woman.  Your weight will continue to increase. You will notice your lower abdomen bulging out.  You may begin to get stretch marks on your hips, abdomen, and breasts.  You may develop headaches that can be relieved by medicines. The medicines should be approved by your health care provider.  You may urinate more often because the fetus is pressing on your bladder.  You may develop or continue to have heartburn as a result of your pregnancy.  You may develop constipation because certain hormones are causing the muscles that push waste through your intestines to slow down.  You may develop hemorrhoids or swollen, bulging veins (varicose veins).  You may have back pain. This is caused by:  Weight gain.  Pregnancy hormones that are relaxing the joints in your pelvis.  A shift in weight and the muscles that support your balance.  Your breasts will continue to grow and they will continue to become tender.  Your gums may bleed and may be sensitive to brushing and flossing.  Dark spots or blotches (chloasma, mask of pregnancy) may develop on your face. This will likely fade after the baby is born.  A dark line  from your belly button to the pubic area (linea nigra) may appear. This will likely fade after the baby is born.  You may have changes in your hair. These can include thickening of your hair, rapid growth, and changes in texture. Some women also have hair loss during or after pregnancy, or hair that feels dry or thin. Your hair will most likely return to normal after your baby is born. What to expect at prenatal visits During a routine prenatal visit:  You will be weighed to make sure you and the fetus are growing normally.  Your blood pressure will be taken.  Your abdomen will be measured to track your baby's growth.  The fetal heartbeat will be listened to.  Any test results from the previous visit will be discussed. Your health care provider may ask you:  How you are feeling.  If you are feeling the baby move.  If you have had any abnormal symptoms, such as leaking fluid, bleeding, severe headaches, or abdominal cramping.  If you are using any tobacco products, including cigarettes, chewing tobacco, and electronic cigarettes.  If you have any questions. Other tests that may be performed during your second trimester include:  Blood tests that check for:  Low iron levels (anemia).  Gestational diabetes (between 24 and 28 weeks).  Rh antibodies. This is to check for a protein on red blood cells (Rh factor).  Urine tests to check for infections, diabetes, or protein in the urine.  An ultrasound to   confirm the proper growth and development of the baby.  An amniocentesis to check for possible genetic problems.  Fetal screens for spina bifida and Down syndrome.  HIV (human immunodeficiency virus) testing. Routine prenatal testing includes screening for HIV, unless you choose not to have this test. Follow these instructions at home: Eating and drinking  Continue to eat regular, healthy meals.  Avoid raw meat, uncooked cheese, cat litter boxes, and soil used by cats. These  carry germs that can cause birth defects in the baby.  Take your prenatal vitamins.  Take 1500-2000 mg of calcium daily starting at the 20th week of pregnancy until you deliver your baby.  If you develop constipation:  Take over-the-counter or prescription medicines.  Drink enough fluid to keep your urine clear or pale yellow.  Eat foods that are high in fiber, such as fresh fruits and vegetables, whole grains, and beans.  Limit foods that are high in fat and processed sugars, such as fried and sweet foods. Activity  Exercise only as directed by your health care provider. Experiencing uterine cramps is a good sign to stop exercising.  Avoid heavy lifting, wear low heel shoes, and practice good posture.  Wear your seat belt at all times when driving.  Rest with your legs elevated if you have leg cramps or low back pain.  Wear a good support bra for breast tenderness.  Do not use hot tubs, steam rooms, or saunas. Lifestyle  Avoid all smoking, herbs, alcohol, and unprescribed drugs. These chemicals affect the formation and growth of the baby.  Do not use any products that contain nicotine or tobacco, such as cigarettes and e-cigarettes. If you need help quitting, ask your health care provider.  A sexual relationship may be continued unless your health care provider directs you otherwise. General instructions  Follow your health care provider's instructions regarding medicine use. There are medicines that are either safe or unsafe to take during pregnancy.  Take warm sitz baths to soothe any pain or discomfort caused by hemorrhoids. Use hemorrhoid cream if your health care provider approves.  If you develop varicose veins, wear support hose. Elevate your feet for 15 minutes, 3-4 times a day. Limit salt in your diet.  Visit your dentist if you have not gone yet during your pregnancy. Use a soft toothbrush to brush your teeth and be gentle when you floss.  Keep all follow-up  prenatal visits as told by your health care provider. This is important. Contact a health care provider if:  You have dizziness.  You have mild pelvic cramps, pelvic pressure, or nagging pain in the abdominal area.  You have persistent nausea, vomiting, or diarrhea.  You have a bad smelling vaginal discharge.  You have pain with urination. Get help right away if:  You have a fever.  You are leaking fluid from your vagina.  You have spotting or bleeding from your vagina.  You have severe abdominal cramping or pain.  You have rapid weight gain or weight loss.  You have shortness of breath with chest pain.  You notice sudden or extreme swelling of your face, hands, ankles, feet, or legs.  You have not felt your baby move in over an hour.  You have severe headaches that do not go away with medicine.  You have vision changes. Summary  The second trimester is from week 13 through week 28 (months 4 through 6). It is also a time when the fetus is growing rapidly.  Your body goes   through many changes during pregnancy. The changes vary from woman to woman.  Avoid all smoking, herbs, alcohol, and unprescribed drugs. These chemicals affect the formation and growth your baby.  Do not use any tobacco products, such as cigarettes, chewing tobacco, and e-cigarettes. If you need help quitting, ask your health care provider.  Contact your health care provider if you have any questions. Keep all prenatal visits as told by your health care provider. This is important. This information is not intended to replace advice given to you by your health care provider. Make sure you discuss any questions you have with your health care provider. Document Released: 07/21/2001 Document Revised: 01/02/2016 Document Reviewed: 09/27/2012 Elsevier Interactive Patient Education  2017 Elsevier Inc.  

## 2016-07-22 ENCOUNTER — Other Ambulatory Visit: Payer: BLUE CROSS/BLUE SHIELD

## 2016-07-22 DIAGNOSIS — Z3682 Encounter for antenatal screening for nuchal translucency: Secondary | ICD-10-CM

## 2016-07-24 LAB — MATERNAL SCREEN, INTEGRATED #2
AFP MoM: 2.01
Alpha-Fetoprotein: 35.8 ng/mL
Crown Rump Length: 60.3 mm
DIA MoM: 1.77
DIA Value: 216.7 pg/mL
Estriol, Unconjugated: 0.82 ng/mL
Gest. Age on Collection Date: 12.4 weeks
Gestational Age: 16.4 weeks
Maternal Age at EDD: 32.2 years
Nuchal Translucency (NT): 1.7 mm
Nuchal Translucency MoM: 1.11
Number of Fetuses: 1
PAPP-A MoM: 2.64
PAPP-A Value: 1054.2 ng/mL
Test Results:: NEGATIVE
Weight: 290 [lb_av]
Weight: 290 [lb_av]
hCG MoM: 2.89
hCG Value: 52.8 IU/mL
uE3 MoM: 1.16

## 2016-08-05 ENCOUNTER — Encounter: Payer: Self-pay | Admitting: Advanced Practice Midwife

## 2016-08-05 ENCOUNTER — Ambulatory Visit (INDEPENDENT_AMBULATORY_CARE_PROVIDER_SITE_OTHER): Payer: BLUE CROSS/BLUE SHIELD

## 2016-08-05 ENCOUNTER — Ambulatory Visit (INDEPENDENT_AMBULATORY_CARE_PROVIDER_SITE_OTHER): Payer: BLUE CROSS/BLUE SHIELD | Admitting: Advanced Practice Midwife

## 2016-08-05 VITALS — BP 140/90 | HR 82 | Wt 286.0 lb

## 2016-08-05 DIAGNOSIS — O3492 Maternal care for abnormality of pelvic organ, unspecified, second trimester: Secondary | ICD-10-CM

## 2016-08-05 DIAGNOSIS — Z1389 Encounter for screening for other disorder: Secondary | ICD-10-CM

## 2016-08-05 DIAGNOSIS — Z363 Encounter for antenatal screening for malformations: Secondary | ICD-10-CM

## 2016-08-05 DIAGNOSIS — Z331 Pregnant state, incidental: Secondary | ICD-10-CM

## 2016-08-05 DIAGNOSIS — Z3482 Encounter for supervision of other normal pregnancy, second trimester: Secondary | ICD-10-CM

## 2016-08-05 DIAGNOSIS — Z3A19 19 weeks gestation of pregnancy: Secondary | ICD-10-CM

## 2016-08-05 LAB — POCT URINALYSIS DIPSTICK
Blood, UA: NEGATIVE
Glucose, UA: NEGATIVE
Leukocytes, UA: NEGATIVE
Nitrite, UA: NEGATIVE

## 2016-08-05 NOTE — Progress Notes (Signed)
G2P1001 2842w0d Estimated Date of Delivery: 12/30/16  Blood pressure 140/90, pulse 82, weight 286 lb (129.7 kg), last menstrual period 04/03/2016.   BP weight and urine results all reviewed and noted.  Radiology     US 19 wks,cephalic,fhr 146 bpm,post pl gr 0, right corpus luteal cyst 3.1 x 2.3 x 2.1 cm,unable to visualize left ov,cx 3.6 cm,svp of fluid 5.5 cm,efw 259 g,anatomy complete,no obvious abnormalities seen       Please refer to the obstetrical flow sheet for the fundal height and fetal heart rate documentation:  Patient reports good fetal movement, denies any bleeding and no rupture of membranes symptoms or regular contractions. Patient is without complaints. All questions were answered.  Orders Placed This Encounter  Procedures  . POCT urinalysis dipstick    Plan:  Continued routine obstetrical care,   Return in about 4 weeks (around 09/02/2016) for LROB.

## 2016-08-05 NOTE — Progress Notes (Signed)
US 19 wks,cephalic,fhr 146 bpm,post pl gr 0, right corpus luteal cyst 3.1 x 2.3 x 2.1 cm,unable to visualize left ov,cx 3.6 cm,svp of fluid 5.5 cm,efw 259 g,anatomy complete,no obvious abnormalities seen

## 2016-08-10 NOTE — L&D Delivery Note (Signed)
31 y.o. G2P1001 at 2528w6d delivered a viable female infant in cephalic, LOA position. no nuchal cord. Anterior shoulder delivered with ease. 60 sec delayed cord clamping. Cord clamped x2 and cut. Placenta delivered spontaneously intact, with 3VC. Fundus firm on exam with massage and pitocin. Good hemostasis noted.  Anesthesia: Epidural Laceration: 2nd degree perineal  Suture: 3.0 Vicryl Good hemostasis noted. EBL: 250 cc  Mom and baby recovering in LDR.    Apgars: APGAR (1 MIN): 9   APGAR (5 MINS): 9   Weight: Pending skin to skin  Sponge and instrument count were correct x2. Placenta sent to L&D.  Howard PouchLauren Derik Fults, MD PGY-1 Family Medicine 12/29/2016, 10:32 AM

## 2016-08-21 ENCOUNTER — Telehealth: Payer: Self-pay | Admitting: Women's Health

## 2016-08-21 NOTE — Telephone Encounter (Signed)
Spoke with pt. Pt is having back pain, started 5 days ago, and pain with urination for 2 days. Pt has a history of UTI's. Pt scheduled an appt with PCP. I advised to keep that appt. Pt voiced understanding. JSY

## 2016-09-02 ENCOUNTER — Ambulatory Visit (INDEPENDENT_AMBULATORY_CARE_PROVIDER_SITE_OTHER): Payer: BLUE CROSS/BLUE SHIELD | Admitting: Women's Health

## 2016-09-02 ENCOUNTER — Encounter: Payer: Self-pay | Admitting: Women's Health

## 2016-09-02 VITALS — BP 128/78 | HR 68 | Wt 284.0 lb

## 2016-09-02 DIAGNOSIS — Z331 Pregnant state, incidental: Secondary | ICD-10-CM

## 2016-09-02 DIAGNOSIS — O2342 Unspecified infection of urinary tract in pregnancy, second trimester: Secondary | ICD-10-CM

## 2016-09-02 DIAGNOSIS — Z3A23 23 weeks gestation of pregnancy: Secondary | ICD-10-CM

## 2016-09-02 DIAGNOSIS — Z1389 Encounter for screening for other disorder: Secondary | ICD-10-CM

## 2016-09-02 DIAGNOSIS — Z3482 Encounter for supervision of other normal pregnancy, second trimester: Secondary | ICD-10-CM

## 2016-09-02 LAB — POCT URINALYSIS DIPSTICK
Blood, UA: NEGATIVE
Glucose, UA: NEGATIVE
Ketones, UA: NEGATIVE
Leukocytes, UA: NEGATIVE
Nitrite, UA: NEGATIVE

## 2016-09-02 NOTE — Progress Notes (Signed)
Low-risk OB appointment G2P1001 7262w0d Estimated Date of Delivery: 12/30/16 BP 128/78   Pulse 68   Wt 284 lb (128.8 kg)   LMP 04/03/2016 (Approximate)   BMI 51.94 kg/m   BP, weight, and urine reviewed.  Refer to obstetrical flow sheet for FH & FHR.  Reports good fm.  Denies regular uc's, lof, vb, or uti s/s.  Went to pcp for symptomatic uti 1/12 w/ bad back pain- no fever/chills, tx w/ macrobid, but changed to amoxicillin when culture returned, finished up antibiotic yesterday. Discussed seeing us for any problems during pregnancy, could have been pyelo and that requires hospitalization during pregnancy. States she called office but didn't have appt for that day. Will send urine cx for poc today.  States pcp told her bilirubin was increased in urine, better when she returned 2nd time and had some RUQ tenderness on palpation. States she has been having some n/v/d after eating fatty/fried/greasy meals, but never any spontaneous RUQ/back pain. To try low-fat diet, if develops severe RUQ pain, uncontrolled n/v to let us know.  FH 34cm, U+18  Reviewed ptl s/s, fm. Plan:  Continue routine obstetrical care  F/U in 4wks for OB appointment and pn2

## 2016-09-02 NOTE — Patient Instructions (Signed)
You will have your sugar test next visit.  Please do not eat or drink anything after midnight the night before you come, not even water.  You will be here for at least two hours.     Call the office ([phone]) or go to [facility] if:  You begin to have strong, frequent contractions  Your water breaks.  Sometimes it is a big gush of fluid, sometimes it is just a trickle that keeps getting your panties wet or running down your legs  You have vaginal bleeding.  It is normal to have a small amount of spotting if your cervix was checked.   You don't feel your baby moving like normal.  If you don't, get you something to eat and drink and lay down and focus on feeling your baby move.   If your baby is still not moving like normal, you should call the office or go to [facility].  Second Trimester of Pregnancy The second trimester is from week 13 through week 28, months 4 through 6. The second trimester is often a time when you feel your best. Your body has also adjusted to being pregnant, and you begin to feel better physically. Usually, morning sickness has lessened or quit completely, you may have more energy, and you may have an increase in appetite. The second trimester is also a time when the fetus is growing rapidly. At the end of the sixth month, the fetus is about 9 inches long and weighs about 1 pounds. You will likely begin to feel the baby move (quickening) between 18 and 20 weeks of the pregnancy. BODY CHANGES Your body goes through many changes during pregnancy. The changes vary from woman to woman.   Your weight will continue to increase. You will notice your lower abdomen bulging out.  You may begin to get stretch marks on your hips, abdomen, and breasts.  You may develop headaches that can be relieved by medicines approved by your health care provider.  You may urinate more often because the fetus is pressing on your bladder.  You may develop or continue to have  heartburn as a result of your pregnancy.  You may develop constipation because certain hormones are causing the muscles that push waste through your intestines to slow down.  You may develop hemorrhoids or swollen, bulging veins (varicose veins).  You may have back pain because of the weight gain and pregnancy hormones relaxing your joints between the bones in your pelvis and as a result of a shift in weight and the muscles that support your balance.  Your breasts will continue to grow and be tender.  Your gums may bleed and may be sensitive to brushing and flossing.  Dark spots or blotches (chloasma, mask of pregnancy) may develop on your face. This will likely fade after the baby is born.  A dark line from your belly button to the pubic area (linea nigra) may appear. This will likely fade after the baby is born.  You may have changes in your hair. These can include thickening of your hair, rapid growth, and changes in texture. Some women also have hair loss during or after pregnancy, or hair that feels dry or thin. Your hair will most likely return to normal after your baby is born. WHAT TO EXPECT AT YOUR PRENATAL VISITS During a routine prenatal visit:  You will be weighed to make sure you and the fetus are growing normally.  Your blood pressure will be taken.    Your abdomen will be measured to track your baby's growth.  The fetal heartbeat will be listened to.  Any test results from the previous visit will be discussed. Your health care provider may ask you:  How you are feeling.  If you are feeling the baby move.  If you have had any abnormal symptoms, such as leaking fluid, bleeding, severe headaches, or abdominal cramping.  If you have any questions. Other tests that may be performed during your second trimester include:  Blood tests that check for:  Low iron levels (anemia).  Gestational diabetes (between 24 and 28 weeks).  Rh antibodies.  Urine tests to check  for infections, diabetes, or protein in the urine.  An ultrasound to confirm the proper growth and development of the baby.  An amniocentesis to check for possible genetic problems.  Fetal screens for spina bifida and Down syndrome. HOME CARE INSTRUCTIONS   Avoid all smoking, herbs, alcohol, and unprescribed drugs. These chemicals affect the formation and growth of the baby.  Follow your health care provider's instructions regarding medicine use. There are medicines that are either safe or unsafe to take during pregnancy.  Exercise only as directed by your health care provider. Experiencing uterine cramps is a good sign to stop exercising.  Continue to eat regular, healthy meals.  Wear a good support bra for breast tenderness.  Do not use hot tubs, steam rooms, or saunas.  Wear your seat belt at all times when driving.  Avoid raw meat, uncooked cheese, cat litter boxes, and soil used by cats. These carry germs that can cause birth defects in the baby.  Take your prenatal vitamins.  Try taking a stool softener (if your health care provider approves) if you develop constipation. Eat more high-fiber foods, such as fresh vegetables or fruit and whole grains. Drink plenty of fluids to keep your urine clear or pale yellow.  Take warm sitz baths to soothe any pain or discomfort caused by hemorrhoids. Use hemorrhoid cream if your health care provider approves.  If you develop varicose veins, wear support hose. Elevate your feet for 15 minutes, 3-4 times a day. Limit salt in your diet.  Avoid heavy lifting, wear low heel shoes, and practice good posture.  Rest with your legs elevated if you have leg cramps or low back pain.  Visit your dentist if you have not gone yet during your pregnancy. Use a soft toothbrush to brush your teeth and be gentle when you floss.  A sexual relationship may be continued unless your health care provider directs you otherwise.  Continue to go to all your  prenatal visits as directed by your health care provider. SEEK MEDICAL CARE IF:   You have dizziness.  You have mild pelvic cramps, pelvic pressure, or nagging pain in the abdominal area.  You have persistent nausea, vomiting, or diarrhea.  You have a bad smelling vaginal discharge.  You have pain with urination. SEEK IMMEDIATE MEDICAL CARE IF:   You have a fever.  You are leaking fluid from your vagina.  You have spotting or bleeding from your vagina.  You have severe abdominal cramping or pain.  You have rapid weight gain or loss.  You have shortness of breath with chest pain.  You notice sudden or extreme swelling of your face, hands, ankles, feet, or legs.  You have not felt your baby move in over an hour.  You have severe headaches that do not go away with medicine.  You have vision changes.   Document Released: 07/21/2001 Document Revised: 08/01/2013 Document Reviewed: 09/27/2012 [facility] Patient Information 2015 [employer], [place]. This information is not intended to replace advice given to you by your health care provider. Make sure you discuss any questions you have with your health care provider.  Low-Fat Diet for Pancreatitis or Gallbladder Conditions A low-fat diet can be helpful if you have pancreatitis or a gallbladder condition. With these conditions, your pancreas and gallbladder have trouble digesting fats. A healthy eating plan with less fat will help rest your pancreas and gallbladder and reduce your symptoms. What do I need to know about this diet?  Eat a low-fat diet.  Reduce your fat intake to less than 20-30% of your total daily calories. This is less than 50-60 g of fat per day.  Remember that you need some fat in your diet. Ask your dietician what your daily goal should be.  Choose nonfat and low-fat healthy foods. Look for the words "nonfat," "low fat," or "fat free."  As a guide, look on the label and choose foods with less than 3 g of fat per  serving. Eat only one serving.  Avoid alcohol.  Do not smoke. If you need help quitting, talk with your health care provider.  Eat small frequent meals instead of three large heavy meals. What foods can I eat? Grains  Include healthy grains and starches such as potatoes, wheat bread, fiber-rich cereal, and brown rice. Choose whole grain options whenever possible. In adults, whole grains should account for 45-65% of your daily calories. Fruits and Vegetables  Eat plenty of fruits and vegetables. Fresh fruits and vegetables add fiber to your diet. Meats and Other Protein Sources  Eat lean meat such as chicken and pork. Trim any fat off of meat before cooking it. Eggs, fish, and beans are other sources of protein. In adults, these foods should account for 10-35% of your daily calories. Dairy  Choose low-fat milk and dairy options. Dairy includes fat and protein, as well as calcium. Fats and Oils  Limit high-fat foods such as fried foods, sweets, baked goods, sugary drinks. Other  Creamy sauces and condiments, such as mayonnaise, can add extra fat. Think about whether or not you need to use them, or use smaller amounts or low fat options. What foods are not recommended?  High fat foods, such as:  [employer] CorporationBaked goods.  Ice cream.  [place] toast.  Sweet rolls.  Pizza.  Cheese bread.  Foods covered with batter, butter, creamy sauces, or cheese.  Fried foods.  Sugary drinks and desserts.  Foods that cause gas or bloating This information is not intended to replace advice given to you by your health care provider. Make sure you discuss any questions you have with your health care provider. Document Released: 08/01/2013 Document Revised: 01/02/2016 Document Reviewed: 07/10/2013 Elsevier Interactive Patient Education  2017 [employer]

## 2016-09-04 ENCOUNTER — Telehealth: Payer: Self-pay | Admitting: Women's Health

## 2016-09-04 DIAGNOSIS — O2342 Unspecified infection of urinary tract in pregnancy, second trimester: Secondary | ICD-10-CM

## 2016-09-04 LAB — URINE CULTURE

## 2016-09-04 MED ORDER — NITROFURANTOIN MONOHYD MACRO 100 MG PO CAPS: 100.0000 mg | ORAL_CAPSULE | Freq: Two times a day (BID) | ORAL | 0 refills | Status: DC

## 2016-09-04 NOTE — Telephone Encounter (Signed)
Called and notified pt of + urine cx, rx macrobid, will send urine cx for poc at next visit. Let us know if has sx after finishing antibiotic or if ever fever/chills, flank pain, etc.  Cheral MarkerKimberly R. Haaris Metallo, CNM, WHNP-BC 09/04/2016 11:55 AM

## 2016-09-24 ENCOUNTER — Encounter: Payer: Self-pay | Admitting: Women's Health

## 2016-09-30 ENCOUNTER — Ambulatory Visit (INDEPENDENT_AMBULATORY_CARE_PROVIDER_SITE_OTHER): Payer: BLUE CROSS/BLUE SHIELD | Admitting: Advanced Practice Midwife

## 2016-09-30 ENCOUNTER — Encounter: Payer: Self-pay | Admitting: Advanced Practice Midwife

## 2016-09-30 ENCOUNTER — Other Ambulatory Visit: Payer: BLUE CROSS/BLUE SHIELD

## 2016-09-30 VITALS — BP 100/80 | Wt 281.0 lb

## 2016-09-30 DIAGNOSIS — Z3A27 27 weeks gestation of pregnancy: Secondary | ICD-10-CM

## 2016-09-30 DIAGNOSIS — Z131 Encounter for screening for diabetes mellitus: Secondary | ICD-10-CM

## 2016-09-30 DIAGNOSIS — Z3482 Encounter for supervision of other normal pregnancy, second trimester: Secondary | ICD-10-CM

## 2016-09-30 DIAGNOSIS — Z331 Pregnant state, incidental: Secondary | ICD-10-CM

## 2016-09-30 DIAGNOSIS — O2342 Unspecified infection of urinary tract in pregnancy, second trimester: Secondary | ICD-10-CM

## 2016-09-30 DIAGNOSIS — Z1389 Encounter for screening for other disorder: Secondary | ICD-10-CM

## 2016-09-30 LAB — POCT URINALYSIS DIPSTICK
Blood, UA: NEGATIVE
Glucose, UA: NEGATIVE
Ketones, UA: NEGATIVE
Leukocytes, UA: NEGATIVE
Nitrite, UA: NEGATIVE

## 2016-09-30 NOTE — Progress Notes (Signed)
G2P1001 369w0d Estimated Date of Delivery: 12/30/16  Blood pressure 100/80, weight 281 lb (127.5 kg), last menstrual period 04/03/2016.   BP weight and urine results all reviewed and noted.  Please refer to the obstetrical flow sheet for the fundal height and fetal heart rate documentation:  Patient reports good fetal movement, denies any bleeding and no rupture of membranes symptoms or regular contractions. Patient is without complaints. All questions were answered.  Orders Placed This Encounter  Procedures  . Urine culture  . POCT urinalysis dipstick    Plan:  Continued routine obstetrical care, PN2 today, POC UTI  Return in about 3 weeks (around 10/21/2016) for LROB.

## 2016-09-30 NOTE — Patient Instructions (Addendum)
Third Trimester of Pregnancy The third trimester is from week 29 through week 40 (months 7 through 9). The third trimester is a time when the unborn baby (fetus) is growing rapidly. At the end of the ninth month, the fetus is about 20 inches in length and weighs 6-10 pounds. Body changes during your third trimester Your body goes through many changes during pregnancy. The changes vary from woman to woman. During the third trimester:  Your weight will continue to increase. You can expect to gain 25-35 pounds (11-16 kg) by the end of the pregnancy.  You may begin to get stretch marks on your hips, abdomen, and breasts.  You may urinate more often because the fetus is moving lower into your pelvis and pressing on your bladder.  You may develop or continue to have heartburn. This is caused by increased hormones that slow down muscles in the digestive tract.  You may develop or continue to have constipation because increased hormones slow digestion and cause the muscles that push waste through your intestines to relax.  You may develop hemorrhoids. These are swollen veins (varicose veins) in the rectum that can itch or be painful.  You may develop swollen, bulging veins (varicose veins) in your legs.  You may have increased body aches in the pelvis, back, or thighs. This is due to weight gain and increased hormones that are relaxing your joints.  You may have changes in your hair. These can include thickening of your hair, rapid growth, and changes in texture. Some women also have hair loss during or after pregnancy, or hair that feels dry or thin. Your hair will most likely return to normal after your baby is born.  Your breasts will continue to grow and they will continue to become tender. A yellow fluid (colostrum) may leak from your breasts. This is the first milk you are producing for your baby.  Your belly button may stick out.  You may notice more swelling in your hands, face, or  ankles.  You may have increased tingling or numbness in your hands, arms, and legs. The skin on your belly may also feel numb.  You may feel short of breath because of your expanding uterus.  You may have more problems sleeping. This can be caused by the size of your belly, increased need to urinate, and an increase in your body's metabolism.  You may notice the fetus "dropping," or moving lower in your abdomen.  You may have increased vaginal discharge.  Your cervix becomes thin and soft (effaced) near your due date. What to expect at prenatal visits You will have prenatal exams every 2 weeks until week 36. Then you will have weekly prenatal exams. During a routine prenatal visit:  You will be weighed to make sure you and the fetus are growing normally.  Your blood pressure will be taken.  Your abdomen will be measured to track your baby's growth.  The fetal heartbeat will be listened to.  Any test results from the previous visit will be discussed.  You may have a cervical check near your due date to see if you have effaced. At around 36 weeks, your health care provider will check your cervix. At the same time, your health care provider will also perform a test on the secretions of the vaginal tissue. This test is to determine if a type of bacteria, Group B streptococcus, is present. Your health care provider will explain this further. Your health care provider may ask you:    What your birth plan is.  How you are feeling.  If you are feeling the baby move.  If you have had any abnormal symptoms, such as leaking fluid, bleeding, severe headaches, or abdominal cramping.  If you are using any tobacco products, including cigarettes, chewing tobacco, and electronic cigarettes.  If you have any questions. Other tests or screenings that may be performed during your third trimester include:  Blood tests that check for low iron levels (anemia).  Fetal testing to check the health,  activity level, and growth of the fetus. Testing is done if you have certain medical conditions or if there are problems during the pregnancy.  Nonstress test (NST). This test checks the health of your baby to make sure there are no signs of problems, such as the baby not getting enough oxygen. During this test, a belt is placed around your belly. The baby is made to move, and its heart rate is monitored during movement. What is false labor? False labor is a condition in which you feel small, irregular tightenings of the muscles in the womb (contractions) that eventually go away. These are called Braxton Hicks contractions. Contractions may last for hours, days, or even weeks before true labor sets in. If contractions come at regular intervals, become more frequent, increase in intensity, or become painful, you should see your health care provider. What are the signs of labor?  Abdominal cramps.  Regular contractions that start at 10 minutes apart and become stronger and more frequent with time.  Contractions that start on the top of the uterus and spread down to the lower abdomen and back.  Increased pelvic pressure and dull back pain.  A watery or bloody mucus discharge that comes from the vagina.  Leaking of amniotic fluid. This is also known as your "water breaking." It could be a slow trickle or a gush. Let your doctor know if it has a color or strange odor. If you have any of these signs, call your health care provider right away, even if it is before your due date. Follow these instructions at home: Eating and drinking  Continue to eat regular, healthy meals.  Do not eat:  Raw meat or meat spreads.  Unpasteurized milk or cheese.  Unpasteurized juice.  Store-made salad.  Refrigerated smoked seafood.  Hot dogs or deli meat, unless they are piping hot.  More than 6 ounces of albacore tuna a week.  Shark, swordfish, king mackerel, or tile fish.  Store-made salads.  Raw  sprouts, such as mung bean or alfalfa sprouts.  Take prenatal vitamins as told by your health care provider.  Take 1000 mg of calcium daily as told by your health care provider.  If you develop constipation:  Take over-the-counter or prescription medicines.  Drink enough fluid to keep your urine clear or pale yellow.  Eat foods that are high in fiber, such as fresh fruits and vegetables, whole grains, and beans.  Limit foods that are high in fat and processed sugars, such as fried and sweet foods. Activity  Exercise only as directed by your health care provider. Healthy pregnant women should aim for 2 hours and 30 minutes of moderate exercise per week. If you experience any pain or discomfort while exercising, stop.  Avoid heavy lifting.  Do not exercise in extreme heat or humidity, or at high altitudes.  Wear low-heel, comfortable shoes.  Practice good posture.  Do not travel far distances unless it is absolutely necessary and only with the approval   of your health care provider.  Wear your seat belt at all times while in a car, on a bus, or on a plane.  Take frequent breaks and rest with your legs elevated if you have leg cramps or low back pain.  Do not use hot tubs, steam rooms, or saunas.  You may continue to have sex unless your health care provider tells you otherwise. Lifestyle  Do not use any products that contain nicotine or tobacco, such as cigarettes and e-cigarettes. If you need help quitting, ask your health care provider.  Do not drink alcohol.  Do not use any medicinal herbs or unprescribed drugs. These chemicals affect the formation and growth of the baby.  If you develop varicose veins:  Wear support pantyhose or compression stockings as told by your healthcare provider.  Elevate your feet for 15 minutes, 3-4 times a day.  Wear a supportive maternity bra to help with breast tenderness. General instructions  Take over-the-counter and prescription  medicines only as told by your health care provider. There are medicines that are either safe or unsafe to take during pregnancy.  Take warm sitz baths to soothe any pain or discomfort caused by hemorrhoids. Use hemorrhoid cream or witch hazel if your health care provider approves.  Avoid cat litter boxes and soil used by cats. These carry germs that can cause birth defects in the baby. If you have a cat, ask someone to clean the litter box for you.  To prepare for the arrival of your baby:  Take prenatal classes to understand, practice, and ask questions about the labor and delivery.  Make a trial run to the hospital.  Visit the hospital and tour the maternity area.  Arrange for maternity or paternity leave through employers.  Arrange for family and friends to take care of pets while you are in the hospital.  Purchase a rear-facing car seat and make sure you know how to install it in your car.  Pack your hospital bag.  Prepare the baby's nursery. Make sure to remove all pillows and stuffed animals from the baby's crib to prevent suffocation.  Visit your dentist if you have not gone during your pregnancy. Use a soft toothbrush to brush your teeth and be gentle when you floss.  Keep all prenatal follow-up visits as told by your health care provider. This is important. Contact a health care provider if:  You are unsure if you are in labor or if your water has broken.  You become dizzy.  You have mild pelvic cramps, pelvic pressure, or nagging pain in your abdominal area.  You have lower back pain.  You have persistent nausea, vomiting, or diarrhea.  You have an unusual or bad smelling vaginal discharge.  You have pain when you urinate. Get help right away if:  You have a fever.  You are leaking fluid from your vagina.  You have spotting or bleeding from your vagina.  You have severe abdominal pain or cramping.  You have rapid weight loss or weight gain.  You have  shortness of breath with chest pain.  You notice sudden or extreme swelling of your face, hands, ankles, feet, or legs.  Your baby makes fewer than 10 movements in 2 hours.  You have severe headaches that do not go away with medicine.  You have vision changes. Summary  The third trimester is from week 29 through week 40, months 7 through 9. The third trimester is a time when the unborn baby (fetus)   is growing rapidly.  During the third trimester, your discomfort may increase as you and your baby continue to gain weight. You may have abdominal, leg, and back pain, sleeping problems, and an increased need to urinate.  During the third trimester your breasts will keep growing and they will continue to become tender. A yellow fluid (colostrum) may leak from your breasts. This is the first milk you are producing for your baby.  False labor is a condition in which you feel small, irregular tightenings of the muscles in the womb (contractions) that eventually go away. These are called Braxton Hicks contractions. Contractions may last for hours, days, or even weeks before true labor sets in.  Signs of labor can include: abdominal cramps; regular contractions that start at 10 minutes apart and become stronger and more frequent with time; watery or bloody mucus discharge that comes from the vagina; increased pelvic pressure and dull back pain; and leaking of amniotic fluid. This information is not intended to replace advice given to you by your health care provider. Make sure you discuss any questions you have with your health care provider. Document Released: 07/21/2001 Document Revised: 01/02/2016 Document Reviewed: 09/27/2012 Elsevier Interactive Patient Education  2017 [employer]  <92 <185 <155

## 2016-10-01 LAB — GLUCOSE TOLERANCE, 2 HOURS W/ 1HR
Glucose, 1 hour: 130 mg/dL (ref 65–179)
Glucose, 2 hour: 94 mg/dL (ref 65–152)
Glucose, Fasting: 79 mg/dL (ref 65–91)

## 2016-10-01 LAB — CBC
Hematocrit: 35.3 % (ref 34.0–46.6)
Hemoglobin: 11.9 g/dL (ref 11.1–15.9)
MCH: 31.5 pg (ref 26.6–33.0)
MCHC: 33.7 g/dL (ref 31.5–35.7)
MCV: 93 fL (ref 79–97)
Platelets: 192 10*3/uL (ref 150–379)
RBC: 3.78 x10E6/uL (ref 3.77–5.28)
RDW: 14.4 % (ref 12.3–15.4)
WBC: 6.5 10*3/uL (ref 3.4–10.8)

## 2016-10-01 LAB — HIV ANTIBODY (ROUTINE TESTING W REFLEX): HIV Screen 4th Generation wRfx: NONREACTIVE

## 2016-10-01 LAB — RPR: RPR Ser Ql: NONREACTIVE

## 2016-10-01 LAB — ANTIBODY SCREEN: Antibody Screen: NEGATIVE

## 2016-10-02 LAB — URINE CULTURE: Organism ID, Bacteria: NO GROWTH

## 2016-10-04 ENCOUNTER — Inpatient Hospital Stay (HOSPITAL_COMMUNITY)
Admission: AD | Admit: 2016-10-04 | Discharge: 2016-10-04 | Disposition: A | Discharge status: Home / Self Care | Payer: BLUE CROSS/BLUE SHIELD | Source: Ambulatory Visit | Attending: Obstetrics and Gynecology | Admitting: Obstetrics and Gynecology

## 2016-10-04 ENCOUNTER — Encounter (HOSPITAL_COMMUNITY): Payer: Self-pay

## 2016-10-04 DIAGNOSIS — O26612 Liver and biliary tract disorders in pregnancy, second trimester: Secondary | ICD-10-CM | POA: Insufficient documentation

## 2016-10-04 DIAGNOSIS — Z3A27 27 weeks gestation of pregnancy: Secondary | ICD-10-CM | POA: Diagnosis not present

## 2016-10-04 DIAGNOSIS — R1010 Upper abdominal pain, unspecified: Secondary | ICD-10-CM | POA: Diagnosis present

## 2016-10-04 DIAGNOSIS — K8050 Calculus of bile duct without cholangitis or cholecystitis without obstruction: Secondary | ICD-10-CM | POA: Diagnosis not present

## 2016-10-04 LAB — CBC
HCT: 31.8 % — ABNORMAL LOW (ref 36.0–46.0)
Hemoglobin: 11.1 g/dL — ABNORMAL LOW (ref 12.0–15.0)
MCH: 31.4 pg (ref 26.0–34.0)
MCHC: 34.9 g/dL (ref 30.0–36.0)
MCV: 90.1 fL (ref 78.0–100.0)
Platelets: 172 10*3/uL (ref 150–400)
RBC: 3.53 MIL/uL — ABNORMAL LOW (ref 3.87–5.11)
RDW: 14.2 % (ref 11.5–15.5)
WBC: 6.7 10*3/uL (ref 4.0–10.5)

## 2016-10-04 LAB — COMPREHENSIVE METABOLIC PANEL
ALT: 9 U/L — ABNORMAL LOW (ref 14–54)
AST: 14 U/L — ABNORMAL LOW (ref 15–41)
Albumin: 2.8 g/dL — ABNORMAL LOW (ref 3.5–5.0)
Alkaline Phosphatase: 69 U/L (ref 38–126)
Anion gap: 6 (ref 5–15)
BUN: 6 mg/dL (ref 6–20)
CO2: 22 mmol/L (ref 22–32)
Calcium: 8.7 mg/dL — ABNORMAL LOW (ref 8.9–10.3)
Chloride: 107 mmol/L (ref 101–111)
Creatinine, Ser: 0.6 mg/dL (ref 0.44–1.00)
GFR calc Af Amer: >60 mL/min (ref >60)
GFR calc non Af Amer: >60 mL/min (ref >60)
Glucose, Bld: 105 mg/dL — ABNORMAL HIGH (ref 65–99)
Potassium: 3.7 mmol/L (ref 3.5–5.1)
Sodium: 135 mmol/L (ref 135–145)
Total Bilirubin: 0.3 mg/dL (ref 0.3–1.2)
Total Protein: 5.9 g/dL — ABNORMAL LOW (ref 6.5–8.1)

## 2016-10-04 LAB — URINALYSIS, ROUTINE W REFLEX MICROSCOPIC
Bilirubin Urine: NEGATIVE
Glucose, UA: NEGATIVE mg/dL
Hgb urine dipstick: NEGATIVE
Ketones, ur: NEGATIVE mg/dL
Leukocytes, UA: NEGATIVE
Nitrite: NEGATIVE
Protein, ur: NEGATIVE mg/dL
Specific Gravity, Urine: 1.016 (ref 1.005–1.030)
pH: 6 (ref 5.0–8.0)

## 2016-10-04 NOTE — MAU Provider Note (Signed)
Chief Complaint: Abdominal Pain; Nausea; and Emesis   First Provider Initiated Contact with Patient 10/04/16 1441     SUBJECTIVE HPI: Kari Maddox is a 31 y.o. G2P1001 at 4387w4d who presents to [facility]aternity Admissions reporting RUQ.  Location: RUQ Quality: fullness Severity: 6/10 in pain scale Duration: intermitent Context: pregnant, has gallbladder, no h/o gallbladder dz Timing: entirety of pregnancy with worsening pain this morning Modifying factors: worse with eating Associated signs and symptoms: diarrhea and nausea with vomiting every other day  Denies contractions, leakage of fluid or vaginal bleeding. Good fetal movement.   Pregnancy Course:   Past Medical History:  Diagnosis Date  . Medical history non-contributory    OB History  Gravida Para Term Preterm AB Living  2 1 1     1   SAB TAB Ectopic Multiple Live Births          1    # Outcome Date GA Lbr Len/2nd Weight Sex Delivery Anes PTL Lv  2 Current           1 Term 07/16/09 10680w6d  7 lb 6 oz (3.345 kg) F Vag-Spont EPI N LIV     Past Surgical History:  Procedure Laterality Date  . TONSILLECTOMY     Family History  Problem Relation Age of Onset  . Cancer Maternal Grandmother     colon  . Diabetes Maternal Grandmother   . Kidney disease Maternal Grandmother   . Hypertension Father   . Hypertension Mother   . Diabetes Mother   . Kidney disease Mother    Social History  Substance Use Topics  . Smoking status: Never Smoker  . Smokeless tobacco: Never Used  . Alcohol use No   No Known Allergies Prescriptions Prior to Admission  Medication Sig Dispense Refill Last Dose  . Doxylamine-Pyridoxine (DICLEGIS) 10-10 MG TBEC 2 tabs q hs, if sx persist add 1 tab q am on day 3, if sx persist add 1 tab q afternoon on day 4 (Patient not taking: Reported on 08/05/2016) 100 tablet 6 Not Taking  . nitrofurantoin, macrocrystal-monohydrate, (MACROBID) 100 MG capsule Take 1 capsule (100 mg total) by mouth 2 (two) times daily.  X 7 days (Patient not taking: Reported on 09/30/2016) 14 capsule 0 Not Taking  . Prenatal Vit-Fe Fumarate-FA (PRENATAL VITAMIN PO) Take by mouth.   Taking    I have reviewed patient's Past Medical Hx, Surgical Hx, Family Hx, Social Hx, medications and allergies.   ROS:  Review of Systems  Constitutional: Negative for chills, diaphoresis, fatigue and fever.  HENT: Negative for congestion, sinus pain and sore throat.   Eyes: Negative for photophobia and visual disturbance.  Respiratory: Negative for cough, chest tightness, shortness of breath and wheezing.   Cardiovascular: Negative for chest pain, palpitations and leg swelling.  Gastrointestinal: Positive for abdominal pain, diarrhea, nausea and vomiting. Negative for constipation.  Genitourinary: Negative for dysuria, flank pain, hematuria, pelvic pain, urgency, vaginal bleeding, vaginal discharge and vaginal pain.  Musculoskeletal: Negative for back pain, neck pain and neck stiffness.  Neurological: Negative for dizziness, weakness, light-headedness and headaches.    Physical Exam   Patient Vitals for the past 24 hrs:  BP Temp Pulse Resp Height Weight  10/04/16 1428 126/77 97.8 F (36.6 C) 91 18 - -  10/04/16 1421 - - - - 5\' 2"  (1.575 m) 282 lb 1.9 oz (128 kg)   Constitutional: Morbidly obese, well-developed, well-nourished female in no acute distress.  Cardiovascular: normal rate Respiratory: normal effort GI: Abd  soft, RUQ tender w/o rebound or guarding, no RLQ tenderness, positive murphy sign, gravid appropriate for gestational age. Pos BS x 4 MS: Extremities nontender, no edema, normal ROM Neurologic: Alert and oriented x 4.  GU: Neg CVAT.  Pelvic: NEFG, physiologic discharge, no blood, cervix clean. No CMT     FHT:  Baseline 145 bpm, moderate variability, accelerations present, no decelerations Contractions: None   Labs: Results for orders placed or performed during the hospital encounter of 10/04/16 (from the past 24  hour(s))  Urinalysis, Routine w reflex microscopic     Status: Abnormal   Collection Time: 10/04/16  2:20 PM  Result Value Ref Range   Color, Urine YELLOW YELLOW   APPearance HAZY (A) CLEAR   Specific Gravity, Urine 1.016 1.005 - 1.030   pH 6.0 5.0 - 8.0   Glucose, UA NEGATIVE NEGATIVE mg/dL   Hgb urine dipstick NEGATIVE NEGATIVE   Bilirubin Urine NEGATIVE NEGATIVE   Ketones, ur NEGATIVE NEGATIVE mg/dL   Protein, ur NEGATIVE NEGATIVE mg/dL   Nitrite NEGATIVE NEGATIVE   Leukocytes, UA NEGATIVE NEGATIVE  CBC     Status: Abnormal   Collection Time: 10/04/16  3:13 PM  Result Value Ref Range   WBC 6.7 4.0 - 10.5 K/uL   RBC 3.53 (L) 3.87 - 5.11 MIL/uL   Hemoglobin 11.1 (L) 12.0 - 15.0 g/dL   HCT 16.1 (L) 09.6 - 04.5 %   MCV 90.1 78.0 - 100.0 fL   MCH 31.4 26.0 - 34.0 pg   MCHC 34.9 30.0 - 36.0 g/dL   RDW 40.9 81.1 - 91.4 %   Platelets 172 150 - 400 K/uL  Comprehensive metabolic panel     Status: Abnormal   Collection Time: 10/04/16  3:13 PM  Result Value Ref Range   Sodium 135 135 - 145 mmol/L   Potassium 3.7 3.5 - 5.1 mmol/L   Chloride 107 101 - 111 mmol/L   CO2 22 22 - 32 mmol/L   Glucose, Bld 105 (H) 65 - 99 mg/dL   BUN 6 6 - 20 mg/dL   Creatinine, Ser 7.82 0.44 - 1.00 mg/dL   Calcium 8.7 (L) 8.9 - 10.3 mg/dL   Total Protein 5.9 (L) 6.5 - 8.1 g/dL   Albumin 2.8 (L) 3.5 - 5.0 g/dL   AST 14 (L) 15 - 41 U/L   ALT 9 (L) 14 - 54 U/L   Alkaline Phosphatase 69 38 - 126 U/L   Total Bilirubin 0.3 0.3 - 1.2 mg/dL   GFR calc non Af Amer >60 >60 mL/min   GFR calc Af Amer >60 >60 mL/min   Anion gap 6 5 - 15    Imaging:  No results found.  MAU Course: Orders Placed This Encounter  Procedures  . Urinalysis, Routine w reflex microscopic  . CBC  . Comprehensive metabolic panel  . Diet - low sodium heart healthy  . Increase activity slowly  . Discharge patient Discharge disposition: 01-Home or Self Care; Discharge patient date: 10/04/2016   No orders of the defined types were  placed in this encounter.  Assessment: 1. Biliary colic    No signs of active labor of fetal strip or h/o labor. RUQ pain c/w gallbladder etiology. Low suspicion for appendicitis given preserved appetite. CBC and CMET reassuring. VS reassuring. No signs of cholecystitis or ductal obstruction.  Plan: --Discharge home in stable condition.  --Reviewed labor precautions and fetal kick counts --Pt instructed to avoid fatty foods --Discussed reasons to return to MAU including  worsening pain, intractable vomiting, new onset fevers, change in fetal activity, or leakage of fluid or vaginal bleeding Follow-up Information    CRESENZO-DISHMAN,FRANCES, CNM. Go on 10/21/2016.   Specialty:  Certified Nurse Midwife Why:  Go to appt at 8:30 AM. Contact information: [address] [place] [address] [phone]           Allergies as of 10/04/2016   No Known Allergies     Medication List    STOP taking these medications   Doxylamine-Pyridoxine 10-10 MG Tbec Commonly known as:  DICLEGIS   nitrofurantoin (macrocrystal-monohydrate) 100 MG capsule Commonly known as:  MACROBID     TAKE these medications   PRENATAL VITAMIN PO Take by mouth.       Wendee Beavers, DO, PGY-1 10/04/2016, 3:55 PM

## 2016-10-04 NOTE — MAU Note (Signed)
Patient presents with N/V which she states has been going on for some time, has lost 25 lbs since August. Now experiencing upper right abdominal pain and lower left abdominal pain which is constant, denies vaginal bleeding.

## 2016-10-04 NOTE — Discharge Instructions (Signed)
Biliary Colic, Adult °Biliary colic is severe pain caused by a problem with a small organ in the upper right part of your belly (gallbladder). The gallbladder stores a digestive fluid produced in the liver (bile) that helps the body break down fat. Bile and other digestive enzymes are carried from the liver to the small intestine though tube-like structures (bile ducts). The gallbladder and the bile ducts form the biliary tract. °Sometimes hard deposits of digestive fluids form in the gallbladder (gallstones) and block the flow of bile from the gallbladder, causing biliary colic. This condition is also called a gallbladder attack. Gallstones can be as small as a grain of sand or as big as a golf ball. There could be just one gallstone in the gallbladder, or there could be many. °What are the causes? °Biliary colic is usually caused by gallstones. Less often, a tumor could block the flow of bile from the gallbladder and trigger biliary colic. °What increases the risk? °This condition is more likely to develop in: °· Women. °· People of Hispanic descent. °· People with a family history of gallstones. °· People who are obese. °· People who suddenly or quickly lose weight. °· People who eat a high-calorie, low-fiber diet that is rich in refined carbs (carbohydrates), such as white bread and white rice. °· People who have an intestinal disease that affects nutrient absorption, such as Crohn disease. °· People who have a metabolic condition, such as metabolic syndrome or diabetes. °What are the signs or symptoms? °Severe pain in the upper right side of the belly is the main symptom of biliary colic. You may feel this pain below the chest but above the hip. This pain often occurs at night or after eating a very fatty meal. This pain may get worse for up to an hour and last as long as 12 hours. In most cases, the pain fades (subsides) within a couple hours. °Other symptoms of this condition include: °· Nausea and  vomiting. °· Pain under the right shoulder. °How is this diagnosed? °This condition is diagnosed based on your medical history, your symptoms, and a physical exam. You may have tests, including: °· Blood tests to rule out infection or inflammation of the bile ducts, gallbladder, pancreas, or liver. °· Imaging studies such as: °¨ Ultrasound. °¨ CT scan. °¨ MRI. °In some cases, you may need to have an imaging study done using a small amount of radioactive material (nuclear medicine) to confirm the diagnosis. °How is this treated? °Treatment for this condition may include medicine to relieve your pain or nausea. If you have gallstones that are causing biliary colic, you may need surgery to remove the gallbladder (cholecystectomy). Gallstones can also be dissolved gradually with medicine. It may take months or years before the gallstones are completely gone. °Follow these instructions at home: °· Take over-the-counter and prescription medicines only as told by your health care provider. °· Drink enough fluid to keep your urine clear or pale yellow. °· Follow instructions from your health care provider about eating or drinking restrictions. These may include avoiding: °¨ Fatty, greasy, and fried foods. °¨ Any foods that make the pain worse. °¨ Overeating. °¨ Having a large meal after not eating for a while. °· Keep all follow-up visits as told by your health care provider. This is important. °How is this prevented? °Steps to prevent this condition include: °· Maintaining a healthy body weight. °· Getting regular exercise. °· Eating a healthy, high-fiber, low-fat diet. °· Limiting how much sugar   and refined carbs you eat, such as sweets, white flour, and white rice. °Contact a health care provider if: °· Your pain lasts more than 5 hours. °· You vomit. °· You have a fever and chills. °· Your pain gets worse. °Get help right away if: °· Your skin or the whites of your eyes look yellow (jaundice). °· Your have tea-colored  urine and light-colored stools. °· You are dizzy or you faint. °This information is not intended to replace advice given to you by your health care provider. Make sure you discuss any questions you have with your health care provider. °Document Released: 12/28/2005 Document Revised: 03/24/2016 Document Reviewed: 02/10/2016 °Elsevier Interactive Patient Education © 2017 [employer] ° °

## 2016-10-04 NOTE — MAU Note (Signed)
C/o N/V which has been going on throughout pregnancy, now having upper right abdominal pain and left lower abdominal pain, 4190w4d fhr reassuring, no contractions seen on efm.

## 2016-10-16 ENCOUNTER — Encounter: Payer: Self-pay | Admitting: Women's Health

## 2016-10-21 ENCOUNTER — Ambulatory Visit (INDEPENDENT_AMBULATORY_CARE_PROVIDER_SITE_OTHER): Payer: BLUE CROSS/BLUE SHIELD | Admitting: Advanced Practice Midwife

## 2016-10-21 ENCOUNTER — Encounter: Payer: Self-pay | Admitting: Advanced Practice Midwife

## 2016-10-21 VITALS — BP 138/86 | HR 90 | Temp 98.3°F | Wt 280.5 lb

## 2016-10-21 DIAGNOSIS — Z1389 Encounter for screening for other disorder: Secondary | ICD-10-CM

## 2016-10-21 DIAGNOSIS — Z331 Pregnant state, incidental: Secondary | ICD-10-CM

## 2016-10-21 DIAGNOSIS — Z3483 Encounter for supervision of other normal pregnancy, third trimester: Secondary | ICD-10-CM

## 2016-10-21 DIAGNOSIS — O99513 Diseases of the respiratory system complicating pregnancy, third trimester: Secondary | ICD-10-CM

## 2016-10-21 DIAGNOSIS — Z3A30 30 weeks gestation of pregnancy: Secondary | ICD-10-CM

## 2016-10-21 LAB — POCT URINALYSIS DIPSTICK
Blood, UA: NEGATIVE
Glucose, UA: NEGATIVE
Ketones, UA: NEGATIVE
Nitrite, UA: NEGATIVE
Protein, UA: NEGATIVE

## 2016-10-21 MED ORDER — AMOXICILLIN 500 MG PO CAPS: 500.0000 mg | ORAL_CAPSULE | Freq: Three times a day (TID) | ORAL | 0 refills | Status: DC

## 2016-10-21 NOTE — Progress Notes (Signed)
G2P1001 7412w0d Estimated Date of Delivery: 12/30/16  Blood pressure 138/86, pulse 90, temperature 98.3 F (36.8 C), weight 280 lb 8 oz (127.2 kg), last menstrual period 04/03/2016.   136/88.  Took Sudafed this morning for URI sx that have been present almost 2 weeks BP weight and urine results all reviewed and noted.  Please refer to the obstetrical flow sheet for the fundal height and fetal heart rate documentation:  Patient reports good fetal movement, denies any bleeding and no rupture of membranes symptoms or regular contractions. Patient is without complaints other that URI sx All questions were answered.  Orders Placed This Encounter  Procedures  . POCT Urinalysis Dipstick    Plan:  Continued routine obstetrical care, BTL papers signed.  Rx amoxicillin 500mg  TID  Return in about 1 week (around 10/28/2016) for LROB. to check BP

## 2016-10-21 NOTE — Patient Instructions (Signed)
Third Trimester of Pregnancy The third trimester is from week 28 through week 40 (months 7 through 9). The third trimester is a time when the unborn baby (fetus) is growing rapidly. At the end of the ninth month, the fetus is about 20 inches in length and weighs 6-10 pounds. Body changes during your third trimester Your body will continue to go through many changes during pregnancy. The changes vary from woman to woman. During the third trimester:  Your weight will continue to increase. You can expect to gain 25-35 pounds (11-16 kg) by the end of the pregnancy.  You may begin to get stretch marks on your hips, abdomen, and breasts.  You may urinate more often because the fetus is moving lower into your pelvis and pressing on your bladder.  You may develop or continue to have heartburn. This is caused by increased hormones that slow down muscles in the digestive tract.  You may develop or continue to have constipation because increased hormones slow digestion and cause the muscles that push waste through your intestines to relax.  You may develop hemorrhoids. These are swollen veins (varicose veins) in the rectum that can itch or be painful.  You may develop swollen, bulging veins (varicose veins) in your legs.  You may have increased body aches in the pelvis, back, or thighs. This is due to weight gain and increased hormones that are relaxing your joints.  You may have changes in your hair. These can include thickening of your hair, rapid growth, and changes in texture. Some women also have hair loss during or after pregnancy, or hair that feels dry or thin. Your hair will most likely return to normal after your baby is born.  Your breasts will continue to grow and they will continue to become tender. A yellow fluid (colostrum) may leak from your breasts. This is the first milk you are producing for your baby.  Your belly button may stick out.  You may notice more swelling in your hands,  face, or ankles.  You may have increased tingling or numbness in your hands, arms, and legs. The skin on your belly may also feel numb.  You may feel short of breath because of your expanding uterus.  You may have more problems sleeping. This can be caused by the size of your belly, increased need to urinate, and an increase in your body's metabolism.  You may notice the fetus "dropping," or moving lower in your abdomen (lightening).  You may have increased vaginal discharge.  You may notice your joints feel loose and you may have pain around your pelvic bone.  What to expect at prenatal visits You will have prenatal exams every 2 weeks until week 36. Then you will have weekly prenatal exams. During a routine prenatal visit:  You will be weighed to make sure you and the baby are growing normally.  Your blood pressure will be taken.  Your abdomen will be measured to track your baby's growth.  The fetal heartbeat will be listened to.  Any test results from the previous visit will be discussed.  You may have a cervical check near your due date to see if your cervix has softened or thinned (effaced).  You will be tested for Group B streptococcus. This happens between 35 and 37 weeks.  Your health care provider may ask you:  What your birth plan is.  How you are feeling.  If you are feeling the baby move.  If you have had   any abnormal symptoms, such as leaking fluid, bleeding, severe headaches, or abdominal cramping.  If you are using any tobacco products, including cigarettes, chewing tobacco, and electronic cigarettes.  If you have any questions.  Other tests or screenings that may be performed during your third trimester include:  Blood tests that check for low iron levels (anemia).  Fetal testing to check the health, activity level, and growth of the fetus. Testing is done if you have certain medical conditions or if there are problems during the  pregnancy.  Nonstress test (NST). This test checks the health of your baby to make sure there are no signs of problems, such as the baby not getting enough oxygen. During this test, a belt is placed around your belly. The baby is made to move, and its heart rate is monitored during movement.  What is false labor? False labor is a condition in which you feel small, irregular tightenings of the muscles in the womb (contractions) that usually go away with rest, changing position, or drinking water. These are called Braxton Hicks contractions. Contractions may last for hours, days, or even weeks before true labor sets in. If contractions come at regular intervals, become more frequent, increase in intensity, or become painful, you should see your health care provider. What are the signs of labor?  Abdominal cramps.  Regular contractions that start at 10 minutes apart and become stronger and more frequent with time.  Contractions that start on the top of the uterus and spread down to the lower abdomen and back.  Increased pelvic pressure and dull back pain.  A watery or bloody mucus discharge that comes from the vagina.  Leaking of amniotic fluid. This is also known as your "water breaking." It could be a slow trickle or a gush. Let your health care provider know if it has a color or strange odor. If you have any of these signs, call your health care provider right away, even if it is before your due date. Follow these instructions at home: Medicines  Follow your health care provider's instructions regarding medicine use. Specific medicines may be either safe or unsafe to take during pregnancy.  Take a prenatal vitamin that contains at least 600 micrograms (mcg) of folic acid.  If you develop constipation, try taking a stool softener if your health care provider approves. Eating and drinking  Eat a balanced diet that includes fresh fruits and vegetables, whole grains, good sources of protein  such as meat, eggs, or tofu, and low-fat dairy. Your health care provider will help you determine the amount of weight gain that is right for you.  Avoid raw meat and uncooked cheese. These carry germs that can cause birth defects in the baby.  If you have low calcium intake from food, talk to your health care provider about whether you should take a daily calcium supplement.  Eat four or five small meals rather than three large meals a day.  Limit foods that are high in fat and processed sugars, such as fried and sweet foods.  To prevent constipation: ? Drink enough fluid to keep your urine clear or pale yellow. ? Eat foods that are high in fiber, such as fresh fruits and vegetables, whole grains, and beans. Activity  Exercise only as directed by your health care provider. Most women can continue their usual exercise routine during pregnancy. Try to exercise for 30 minutes at least 5 days a week. Stop exercising if you experience uterine contractions.  Avoid heavy   lifting.  Do not exercise in extreme heat or humidity, or at high altitudes.  Wear low-heel, comfortable shoes.  Practice good posture.  You may continue to have sex unless your health care provider tells you otherwise. Relieving pain and discomfort  Take frequent breaks and rest with your legs elevated if you have leg cramps or low back pain.  Take warm sitz baths to soothe any pain or discomfort caused by hemorrhoids. Use hemorrhoid cream if your health care provider approves.  Wear a good support bra to prevent discomfort from breast tenderness.  If you develop varicose veins: ? Wear support pantyhose or compression stockings as told by your healthcare provider. ? Elevate your feet for 15 minutes, 3-4 times a day. Prenatal care  Write down your questions. Take them to your prenatal visits.  Keep all your prenatal visits as told by your health care provider. This is important. Safety  Wear your seat belt at  all times when driving.  Make a list of emergency phone numbers, including numbers for family, friends, the hospital, and police and fire departments. General instructions  Avoid cat litter boxes and soil used by cats. These carry germs that can cause birth defects in the baby. If you have a cat, ask someone to clean the litter box for you.  Do not travel far distances unless it is absolutely necessary and only with the approval of your health care provider.  Do not use hot tubs, steam rooms, or saunas.  Do not drink alcohol.  Do not use any products that contain nicotine or tobacco, such as cigarettes and e-cigarettes. If you need help quitting, ask your health care provider.  Do not use any medicinal herbs or unprescribed drugs. These chemicals affect the formation and growth of the baby.  Do not douche or use tampons or scented sanitary pads.  Do not cross your legs for long periods of time.  To prepare for the arrival of your baby: ? Take prenatal classes to understand, practice, and ask questions about labor and delivery. ? Make a trial run to the hospital. ? Visit the hospital and tour the maternity area. ? Arrange for maternity or paternity leave through employers. ? Arrange for family and friends to take care of pets while you are in the hospital. ? Purchase a rear-facing car seat and make sure you know how to install it in your car. ? Pack your hospital bag. ? Prepare the baby's nursery. Make sure to remove all pillows and stuffed animals from the baby's crib to prevent suffocation.  Visit your dentist if you have not gone during your pregnancy. Use a soft toothbrush to brush your teeth and be gentle when you floss. Contact a health care provider if:  You are unsure if you are in labor or if your water has broken.  You become dizzy.  You have mild pelvic cramps, pelvic pressure, or nagging pain in your abdominal area.  You have lower back pain.  You have persistent  nausea, vomiting, or diarrhea.  You have an unusual or bad smelling vaginal discharge.  You have pain when you urinate. Get help right away if:  Your water breaks before 37 weeks.  You have regular contractions less than 5 minutes apart before 37 weeks.  You have a fever.  You are leaking fluid from your vagina.  You have spotting or bleeding from your vagina.  You have severe abdominal pain or cramping.  You have rapid weight loss or weight gain.    You have shortness of breath with chest pain.  You notice sudden or extreme swelling of your face, hands, ankles, feet, or legs.  Your baby makes fewer than 10 movements in 2 hours.  You have severe headaches that do not go away when you take medicine.  You have vision changes. Summary  The third trimester is from week 28 through week 40, months 7 through 9. The third trimester is a time when the unborn baby (fetus) is growing rapidly.  During the third trimester, your discomfort may increase as you and your baby continue to gain weight. You may have abdominal, leg, and back pain, sleeping problems, and an increased need to urinate.  During the third trimester your breasts will keep growing and they will continue to become tender. A yellow fluid (colostrum) may leak from your breasts. This is the first milk you are producing for your baby.  False labor is a condition in which you feel small, irregular tightenings of the muscles in the womb (contractions) that eventually go away. These are called Braxton Hicks contractions. Contractions may last for hours, days, or even weeks before true labor sets in.  Signs of labor can include: abdominal cramps; regular contractions that start at 10 minutes apart and become stronger and more frequent with time; watery or bloody mucus discharge that comes from the vagina; increased pelvic pressure and dull back pain; and leaking of amniotic fluid. This information is not intended to replace advice  given to you by your health care provider. Make sure you discuss any questions you have with your health care provider. Document Released: 07/21/2001 Document Revised: 01/02/2016 Document Reviewed: 09/27/2012 Elsevier Interactive Patient Education  2017 [employer]  

## 2016-10-28 ENCOUNTER — Encounter: Payer: BLUE CROSS/BLUE SHIELD | Admitting: Women's Health

## 2016-11-04 ENCOUNTER — Ambulatory Visit (INDEPENDENT_AMBULATORY_CARE_PROVIDER_SITE_OTHER): Payer: BLUE CROSS/BLUE SHIELD | Admitting: Advanced Practice Midwife

## 2016-11-04 ENCOUNTER — Encounter: Payer: Self-pay | Admitting: Advanced Practice Midwife

## 2016-11-04 VITALS — BP 120/90 | HR 74 | Wt 283.0 lb

## 2016-11-04 DIAGNOSIS — Z1389 Encounter for screening for other disorder: Secondary | ICD-10-CM

## 2016-11-04 DIAGNOSIS — Z3483 Encounter for supervision of other normal pregnancy, third trimester: Secondary | ICD-10-CM

## 2016-11-04 DIAGNOSIS — Z331 Pregnant state, incidental: Secondary | ICD-10-CM

## 2016-11-04 LAB — POCT URINALYSIS DIPSTICK
Blood, UA: NEGATIVE
Glucose, UA: NEGATIVE
Leukocytes, UA: NEGATIVE
Nitrite, UA: NEGATIVE

## 2016-11-04 NOTE — Patient Instructions (Signed)
Third Trimester of Pregnancy The third trimester is from week 28 through week 40 (months 7 through 9). The third trimester is a time when the unborn baby (fetus) is growing rapidly. At the end of the ninth month, the fetus is about 20 inches in length and weighs 6-10 pounds. Body changes during your third trimester Your body will continue to go through many changes during pregnancy. The changes vary from woman to woman. During the third trimester:  Your weight will continue to increase. You can expect to gain 25-35 pounds (11-16 kg) by the end of the pregnancy.  You may begin to get stretch marks on your hips, abdomen, and breasts.  You may urinate more often because the fetus is moving lower into your pelvis and pressing on your bladder.  You may develop or continue to have heartburn. This is caused by increased hormones that slow down muscles in the digestive tract.  You may develop or continue to have constipation because increased hormones slow digestion and cause the muscles that push waste through your intestines to relax.  You may develop hemorrhoids. These are swollen veins (varicose veins) in the rectum that can itch or be painful.  You may develop swollen, bulging veins (varicose veins) in your legs.  You may have increased body aches in the pelvis, back, or thighs. This is due to weight gain and increased hormones that are relaxing your joints.  You may have changes in your hair. These can include thickening of your hair, rapid growth, and changes in texture. Some women also have hair loss during or after pregnancy, or hair that feels dry or thin. Your hair will most likely return to normal after your baby is born.  Your breasts will continue to grow and they will continue to become tender. A yellow fluid (colostrum) may leak from your breasts. This is the first milk you are producing for your baby.  Your belly button may stick out.  You may notice more swelling in your hands,  face, or ankles.  You may have increased tingling or numbness in your hands, arms, and legs. The skin on your belly may also feel numb.  You may feel short of breath because of your expanding uterus.  You may have more problems sleeping. This can be caused by the size of your belly, increased need to urinate, and an increase in your body's metabolism.  You may notice the fetus "dropping," or moving lower in your abdomen (lightening).  You may have increased vaginal discharge.  You may notice your joints feel loose and you may have pain around your pelvic bone.  What to expect at prenatal visits You will have prenatal exams every 2 weeks until week 36. Then you will have weekly prenatal exams. During a routine prenatal visit:  You will be weighed to make sure you and the baby are growing normally.  Your blood pressure will be taken.  Your abdomen will be measured to track your baby's growth.  The fetal heartbeat will be listened to.  Any test results from the previous visit will be discussed.  You may have a cervical check near your due date to see if your cervix has softened or thinned (effaced).  You will be tested for Group B streptococcus. This happens between 35 and 37 weeks.  Your health care provider may ask you:  What your birth plan is.  How you are feeling.  If you are feeling the baby move.  If you have had   any abnormal symptoms, such as leaking fluid, bleeding, severe headaches, or abdominal cramping.  If you are using any tobacco products, including cigarettes, chewing tobacco, and electronic cigarettes.  If you have any questions.  Other tests or screenings that may be performed during your third trimester include:  Blood tests that check for low iron levels (anemia).  Fetal testing to check the health, activity level, and growth of the fetus. Testing is done if you have certain medical conditions or if there are problems during the  pregnancy.  Nonstress test (NST). This test checks the health of your baby to make sure there are no signs of problems, such as the baby not getting enough oxygen. During this test, a belt is placed around your belly. The baby is made to move, and its heart rate is monitored during movement.  What is false labor? False labor is a condition in which you feel small, irregular tightenings of the muscles in the womb (contractions) that usually go away with rest, changing position, or drinking water. These are called Braxton Hicks contractions. Contractions may last for hours, days, or even weeks before true labor sets in. If contractions come at regular intervals, become more frequent, increase in intensity, or become painful, you should see your health care provider. What are the signs of labor?  Abdominal cramps.  Regular contractions that start at 10 minutes apart and become stronger and more frequent with time.  Contractions that start on the top of the uterus and spread down to the lower abdomen and back.  Increased pelvic pressure and dull back pain.  A watery or bloody mucus discharge that comes from the vagina.  Leaking of amniotic fluid. This is also known as your "water breaking." It could be a slow trickle or a gush. Let your health care provider know if it has a color or strange odor. If you have any of these signs, call your health care provider right away, even if it is before your due date. Follow these instructions at home: Medicines  Follow your health care provider's instructions regarding medicine use. Specific medicines may be either safe or unsafe to take during pregnancy.  Take a prenatal vitamin that contains at least 600 micrograms (mcg) of folic acid.  If you develop constipation, try taking a stool softener if your health care provider approves. Eating and drinking  Eat a balanced diet that includes fresh fruits and vegetables, whole grains, good sources of protein  such as meat, eggs, or tofu, and low-fat dairy. Your health care provider will help you determine the amount of weight gain that is right for you.  Avoid raw meat and uncooked cheese. These carry germs that can cause birth defects in the baby.  If you have low calcium intake from food, talk to your health care provider about whether you should take a daily calcium supplement.  Eat four or five small meals rather than three large meals a day.  Limit foods that are high in fat and processed sugars, such as fried and sweet foods.  To prevent constipation: ? Drink enough fluid to keep your urine clear or pale yellow. ? Eat foods that are high in fiber, such as fresh fruits and vegetables, whole grains, and beans. Activity  Exercise only as directed by your health care provider. Most women can continue their usual exercise routine during pregnancy. Try to exercise for 30 minutes at least 5 days a week. Stop exercising if you experience uterine contractions.  Avoid heavy   lifting.  Do not exercise in extreme heat or humidity, or at high altitudes.  Wear low-heel, comfortable shoes.  Practice good posture.  You may continue to have sex unless your health care provider tells you otherwise. Relieving pain and discomfort  Take frequent breaks and rest with your legs elevated if you have leg cramps or low back pain.  Take warm sitz baths to soothe any pain or discomfort caused by hemorrhoids. Use hemorrhoid cream if your health care provider approves.  Wear a good support bra to prevent discomfort from breast tenderness.  If you develop varicose veins: ? Wear support pantyhose or compression stockings as told by your healthcare provider. ? Elevate your feet for 15 minutes, 3-4 times a day. Prenatal care  Write down your questions. Take them to your prenatal visits.  Keep all your prenatal visits as told by your health care provider. This is important. Safety  Wear your seat belt at  all times when driving.  Make a list of emergency phone numbers, including numbers for family, friends, the hospital, and police and fire departments. General instructions  Avoid cat litter boxes and soil used by cats. These carry germs that can cause birth defects in the baby. If you have a cat, ask someone to clean the litter box for you.  Do not travel far distances unless it is absolutely necessary and only with the approval of your health care provider.  Do not use hot tubs, steam rooms, or saunas.  Do not drink alcohol.  Do not use any products that contain nicotine or tobacco, such as cigarettes and e-cigarettes. If you need help quitting, ask your health care provider.  Do not use any medicinal herbs or unprescribed drugs. These chemicals affect the formation and growth of the baby.  Do not douche or use tampons or scented sanitary pads.  Do not cross your legs for long periods of time.  To prepare for the arrival of your baby: ? Take prenatal classes to understand, practice, and ask questions about labor and delivery. ? Make a trial run to the hospital. ? Visit the hospital and tour the maternity area. ? Arrange for maternity or paternity leave through employers. ? Arrange for family and friends to take care of pets while you are in the hospital. ? Purchase a rear-facing car seat and make sure you know how to install it in your car. ? Pack your hospital bag. ? Prepare the baby's nursery. Make sure to remove all pillows and stuffed animals from the baby's crib to prevent suffocation.  Visit your dentist if you have not gone during your pregnancy. Use a soft toothbrush to brush your teeth and be gentle when you floss. Contact a health care provider if:  You are unsure if you are in labor or if your water has broken.  You become dizzy.  You have mild pelvic cramps, pelvic pressure, or nagging pain in your abdominal area.  You have lower back pain.  You have persistent  nausea, vomiting, or diarrhea.  You have an unusual or bad smelling vaginal discharge.  You have pain when you urinate. Get help right away if:  Your water breaks before 37 weeks.  You have regular contractions less than 5 minutes apart before 37 weeks.  You have a fever.  You are leaking fluid from your vagina.  You have spotting or bleeding from your vagina.  You have severe abdominal pain or cramping.  You have rapid weight loss or weight gain.    You have shortness of breath with chest pain.  You notice sudden or extreme swelling of your face, hands, ankles, feet, or legs.  Your baby makes fewer than 10 movements in 2 hours.  You have severe headaches that do not go away when you take medicine.  You have vision changes. Summary  The third trimester is from week 28 through week 40, months 7 through 9. The third trimester is a time when the unborn baby (fetus) is growing rapidly.  During the third trimester, your discomfort may increase as you and your baby continue to gain weight. You may have abdominal, leg, and back pain, sleeping problems, and an increased need to urinate.  During the third trimester your breasts will keep growing and they will continue to become tender. A yellow fluid (colostrum) may leak from your breasts. This is the first milk you are producing for your baby.  False labor is a condition in which you feel small, irregular tightenings of the muscles in the womb (contractions) that eventually go away. These are called Braxton Hicks contractions. Contractions may last for hours, days, or even weeks before true labor sets in.  Signs of labor can include: abdominal cramps; regular contractions that start at 10 minutes apart and become stronger and more frequent with time; watery or bloody mucus discharge that comes from the vagina; increased pelvic pressure and dull back pain; and leaking of amniotic fluid. This information is not intended to replace advice  given to you by your health care provider. Make sure you discuss any questions you have with your health care provider. Document Released: 07/21/2001 Document Revised: 01/02/2016 Document Reviewed: 09/27/2012 Elsevier Interactive Patient Education  2017 [employer]  

## 2016-11-04 NOTE — Progress Notes (Signed)
G2P1001 [id number] Estimated Date of Delivery: 12/30/16  Blood pressure 120/90, pulse 74, weight 283 lb (128.4 kg), last menstrual period 04/03/2016.   BP weight and urine results all reviewed and noted. BP has been up and down throughout the pregnancy.  Takes it at work "every now and then" and it's never been <140/90.  Please refer to the obstetrical flow sheet for the fundal height and fetal heart rate documentation:  Patient reports good fetal movement, denies any bleeding and no rupture of membranes symptoms or regular contractions. Patient is without complaints.  Still has a little sinus issue on left side, uses netti pot.  All questions were answered.  Orders Placed This Encounter  Procedures  . POCT urinalysis dipstick    Plan:  Continued routine obstetrical care,   Return in about 1 week (around 11/11/2016) for LROB, blood pressure check .  See MD to try to make a decision about whether antenatal testing is indicated at this point

## 2016-11-10 ENCOUNTER — Ambulatory Visit (INDEPENDENT_AMBULATORY_CARE_PROVIDER_SITE_OTHER): Payer: BLUE CROSS/BLUE SHIELD | Admitting: Obstetrics & Gynecology

## 2016-11-10 ENCOUNTER — Encounter: Payer: Self-pay | Admitting: Obstetrics & Gynecology

## 2016-11-10 VITALS — BP 128/82 | HR 80 | Wt 286.0 lb

## 2016-11-10 DIAGNOSIS — Z331 Pregnant state, incidental: Secondary | ICD-10-CM

## 2016-11-10 DIAGNOSIS — Z3483 Encounter for supervision of other normal pregnancy, third trimester: Secondary | ICD-10-CM

## 2016-11-10 DIAGNOSIS — Z1389 Encounter for screening for other disorder: Secondary | ICD-10-CM

## 2016-11-10 LAB — POCT URINALYSIS DIPSTICK
Glucose, UA: NEGATIVE
Ketones, UA: NEGATIVE
Leukocytes, UA: NEGATIVE
Nitrite, UA: NEGATIVE
Protein, UA: NEGATIVE

## 2016-11-10 NOTE — Progress Notes (Signed)
G2P1001 [redacted]w[redacted]d Estimated Date of Delivery: 12/30/16  Blood pressure 128/82, pulse 80, weight 286 lb (129.7 kg), last menstrual period 04/03/2016.   BP weight and urine results all reviewed and noted.  Please refer to the obstetrical flow sheet for the fundal height and fetal heart rate documentation:  Patient reports good fetal movement, denies any bleeding and no rupture of membranes symptoms or regular contractions. Patient is without complaints. All questions were answered.  Orders Placed This Encounter  Procedures  . POCT urinalysis dipstick    Plan:  Continued routine obstetrical care, BP stable U+17 No headache or CNS symptoms Will start charting BP and bring in log  Return in about 2 weeks (around 11/24/2016) for LROB.

## 2016-11-17 ENCOUNTER — Encounter: Payer: Self-pay | Admitting: Advanced Practice Midwife

## 2016-11-25 ENCOUNTER — Ambulatory Visit (INDEPENDENT_AMBULATORY_CARE_PROVIDER_SITE_OTHER): Payer: BLUE CROSS/BLUE SHIELD | Admitting: Advanced Practice Midwife

## 2016-11-25 ENCOUNTER — Encounter: Payer: Self-pay | Admitting: Advanced Practice Midwife

## 2016-11-25 VITALS — BP 120/80 | HR 76 | Wt 277.0 lb

## 2016-11-25 DIAGNOSIS — Z331 Pregnant state, incidental: Secondary | ICD-10-CM

## 2016-11-25 DIAGNOSIS — O26843 Uterine size-date discrepancy, third trimester: Secondary | ICD-10-CM

## 2016-11-25 DIAGNOSIS — Z3483 Encounter for supervision of other normal pregnancy, third trimester: Secondary | ICD-10-CM

## 2016-11-25 DIAGNOSIS — Z1389 Encounter for screening for other disorder: Secondary | ICD-10-CM

## 2016-11-25 LAB — POCT URINALYSIS DIPSTICK
Blood, UA: NEGATIVE
Glucose, UA: NEGATIVE
Leukocytes, UA: NEGATIVE
Nitrite, UA: NEGATIVE

## 2016-11-25 NOTE — Progress Notes (Signed)
G2P1001 [redacted]w[redacted]d Estimated Date of Delivery: 12/30/16  Blood pressure 120/80, pulse 76, weight 277 lb (125.6 kg), last menstrual period 04/03/2016.   BP weight and urine results all reviewed and noted. Takes BP at home and all are excellent.  BABY IS BREECH  Please refer to the obstetrical flow sheet for the fundal height and fetal heart rate documentation:  Patient reports good fetal movement, denies any bleeding and no rupture of membranes symptoms or regular contractions. Patient is without complaints. All questions were answered.   Orders Placed This Encounter  Procedures  . POCT urinalysis dipstick    Plan:  Continued routine obstetrical care, continue home BP monitoring  Return in about 1 week (around 12/02/2016) for LROB, US:EFW/AFI.  Discussed ECV if still breech

## 2016-11-25 NOTE — Patient Instructions (Addendum)
AM I IN LABOR? What is labor? Labor is the work that your body does to birth your baby. Your uterus (the womb) contracts. Your cervix (the mouth of the uterus) opens. You will push your baby out into the world.  What do contractions (labor pains) feel like? When they first start, contractions usually feel like cramps during your period. Sometimes you feel pain in your back. Most often, contractions feel like muscles pulling painfully in your lower belly. At first, the contractions will probably be 15 to 20 minutes apart. They will not feel too painful. As labor goes on, the contractions get stronger, closer together, and more painful.  How do I time the contractions? Time your contractions by counting the number of minutes from the start of one contraction to the start of the next contraction.  What should I do when the contractions start? If it is night and you can sleep, sleep. If it happens during the day, here are some things you can do to take care of yourself at home: ? Walk. If the pains you are having are real labor, walking will make the contractions come faster and harder. If the contractions are not going to continue and be real labor, walking will make the contractions slow down. ? Take a shower or bath. This will help you relax. ? Eat. Labor is a big event. It takes a lot of energy. ? Drink water. Not drinking enough water can cause false labor (contractions that hurt but do not open your cervix). If this is true labor, drinking water will help you have strength to get through your labor. ? Take a nap. Get all the rest you can. ? Get a massage. If your labor is in your back, a strong massage on your lower back may feel very good. Getting a foot massage is always good. ? Don't panic. You can do this. Your body was made for this. You are strong!  When should I go to the hospital or call my health care provider? ? Your contractions have been 5 minutes apart or less for at least 1  hour. ? If several contractions are so painful you cannot walk or talk during one. ? Your bag of waters breaks. (You may have a big gush of water or just water that runs down your legs when you walk.)  Are there other reasons to call my health care provider? Yes, you should call your health care provider or go to the hospital if you start to bleed like you are having a period- blood that soaks your underwear or runs down your legs, if you have sudden severe pain, if your baby has not moved for several hours, or if you are leaking green fluid. The rule is as follows: If you are very concerned about something, call.    Breech Birth What is a breech birth? A breech birth is when a baby is born with the buttocks or the feet first. Most babies are in a head down (vertex) position when they are born. There are three types of breech babies:  When the baby's buttocks are showing first in the birth canal (vagina) with the legs straight up and the feet at the baby's head (frank breech).  When the baby's buttocks shows first with the legs bent at the knees and the feet down near the buttocks (complete breech).  When one or both of the baby's feet are down below the buttocks (footling breech). What are the risks of a  breech birth? Having a breech birth increases the risk to your baby. A breech birth may cause the following:  Umbilical cord prolapse. This is when the umbilical cord is in front of the baby before or during labor. This can cause the cord to become pinched or compressed. This can reduce the flow of blood and oxygen to the baby.  The baby getting stuck in the birth canal, which can cause injury or, rarely, death.  Injury to the nerves in the shoulder, arm, and hand (brachial plexus injury) when delivered.  Your baby being born too early (prematurely).  An increased need for a cesarean delivery. What increases the risk of having a breech baby? It is not known what causes your baby to be  breech. However, risk factors that may increase your chances of having a breech baby include the following:  The mother having had several babies already.  The mother having twins or more.  The mother having a baby with certain congenital disabilities.  The mother going into labor early.  The mother having problems with her uterus, such as a tumor.  The mother having placenta problems (placenta previa) or too much or not enough fluid surrounding the baby (amniotic fluid). How do I know if my baby is breech? There are no symptoms for you to know that your baby is breech. When you are close to your due date, your health care provider can tell if your baby is breech by:  An abdominal or vaginal (pelvic) exam.  An ultrasound. Your health care provider may also be able to tell that your baby is breech if your baby's heartbeat is heard above your belly button. What can be done if my baby is breech?   Your health care provider may try to turn the baby in your uterus. This is a procedure called external cephalic version (ECV). This is done by your health care provider. He or she will place both hands on your abdomen and gently and slowly turn the baby around. It is important to know that ECV can increase your chances of suddenly going into labor. If an ECV is done, it is done toward the end of a healthy pregnancy. The baby may remain in this position or he or she may turn back to the breech position. You and your health care provider will discuss if an ECV is recommended for you and your baby. How will I delivery my baby if my baby is breech? You and your health care provider will discuss the best way to deliver your baby. If your baby is breech, it is less likely that a vaginal delivery will be recommended due to the risks. Some breech babies may be delivered safely without a cesarean, while in other cases health care providers will recommend a cesarean delivery. This information is not intended  to replace advice given to you by your health care provider. Make sure you discuss any questions you have with your health care provider. Document Released: 09/17/2006 Document Revised: 07/13/2016 Document Reviewed: 05/31/2014 Elsevier Interactive Patient Education  2017 [employer].

## 2016-12-02 ENCOUNTER — Ambulatory Visit (INDEPENDENT_AMBULATORY_CARE_PROVIDER_SITE_OTHER): Payer: BLUE CROSS/BLUE SHIELD | Admitting: Advanced Practice Midwife

## 2016-12-02 ENCOUNTER — Encounter: Payer: Self-pay | Admitting: Advanced Practice Midwife

## 2016-12-02 ENCOUNTER — Other Ambulatory Visit: Payer: BLUE CROSS/BLUE SHIELD

## 2016-12-02 ENCOUNTER — Ambulatory Visit (INDEPENDENT_AMBULATORY_CARE_PROVIDER_SITE_OTHER): Payer: BLUE CROSS/BLUE SHIELD

## 2016-12-02 VITALS — BP 132/72 | HR 92 | Wt 280.0 lb

## 2016-12-02 DIAGNOSIS — Z3483 Encounter for supervision of other normal pregnancy, third trimester: Secondary | ICD-10-CM

## 2016-12-02 DIAGNOSIS — Z1389 Encounter for screening for other disorder: Secondary | ICD-10-CM

## 2016-12-02 DIAGNOSIS — O26843 Uterine size-date discrepancy, third trimester: Secondary | ICD-10-CM | POA: Diagnosis not present

## 2016-12-02 DIAGNOSIS — O1213 Gestational proteinuria, third trimester: Secondary | ICD-10-CM

## 2016-12-02 DIAGNOSIS — O1210 Gestational proteinuria, unspecified trimester: Secondary | ICD-10-CM

## 2016-12-02 DIAGNOSIS — Z331 Pregnant state, incidental: Secondary | ICD-10-CM

## 2016-12-02 LAB — POCT URINALYSIS DIPSTICK
Blood, UA: NEGATIVE
Glucose, UA: NEGATIVE
Ketones, UA: NEGATIVE
Leukocytes, UA: NEGATIVE
Nitrite, UA: NEGATIVE

## 2016-12-02 NOTE — Progress Notes (Signed)
G2P1001 [redacted]w[redacted]d Estimated Date of Delivery: 12/30/16  Blood pressure 132/72, pulse 92, weight 280 lb (127 kg), last menstrual period 04/03/2016.   BP weight and urine results all reviewed and noted.  Please refer to the obstetrical flow sheet for the fundal height and fetal heart rate documentation:  [place] 36 wks,cephalic,post pl gr 3,fhr 142 bpm,normal ov's bilat,afi 10 cm,bilat,EFW 3140 g 71%,AC 96%  Patient reports good fetal movement, denies any bleeding and no rupture of membranes symptoms or regular contractions. Patient is without complaints other than normal pregnancy complaints.  All questions were answered.  Orders Placed This Encounter  Procedures  . Urine culture  . POCT urinalysis dipstick    Plan:  Continued routine obstetrical care,   Return in about 1 week (around 12/09/2016) for LROB.

## 2016-12-02 NOTE — Progress Notes (Signed)
[place] 36 wks,cephalic,post pl gr 3,fhr 142 bpm,normal ov's bilat,afi 10 cm,bilat,EFW 3140 g 71%,AC 96%

## 2016-12-04 LAB — URINE CULTURE

## 2016-12-09 ENCOUNTER — Encounter: Payer: Self-pay | Admitting: Advanced Practice Midwife

## 2016-12-09 ENCOUNTER — Ambulatory Visit (INDEPENDENT_AMBULATORY_CARE_PROVIDER_SITE_OTHER): Payer: BLUE CROSS/BLUE SHIELD | Admitting: Advanced Practice Midwife

## 2016-12-09 VITALS — BP 122/70 | HR 86 | Wt 276.0 lb

## 2016-12-09 DIAGNOSIS — Z3483 Encounter for supervision of other normal pregnancy, third trimester: Secondary | ICD-10-CM

## 2016-12-09 DIAGNOSIS — Z331 Pregnant state, incidental: Secondary | ICD-10-CM

## 2016-12-09 DIAGNOSIS — Z1389 Encounter for screening for other disorder: Secondary | ICD-10-CM

## 2016-12-09 DIAGNOSIS — Z3A37 37 weeks gestation of pregnancy: Secondary | ICD-10-CM

## 2016-12-09 LAB — POCT URINALYSIS DIPSTICK
Blood, UA: NEGATIVE
Glucose, UA: NEGATIVE
Ketones, UA: NEGATIVE
Leukocytes, UA: NEGATIVE
Nitrite, UA: NEGATIVE

## 2016-12-09 LAB — OB RESULTS CONSOLE GBS: GBS: NEGATIVE

## 2016-12-09 NOTE — Progress Notes (Signed)
G2P1001 [redacted]w[redacted]d Estimated Date of Delivery: 12/30/16  Blood pressure 122/70, pulse 86, weight 276 lb (125.2 kg), last menstrual period 04/03/2016.   BP weight and urine results all reviewed and noted.  Please refer to the obstetrical flow sheet for the fundal height and fetal heart rate documentation:  Patient reports good fetal movement, denies any bleeding and no rupture of membranes symptoms or regular contractions. Patient is without complaints. All questions were answered.  Orders Placed This Encounter  Procedures  . Culture, beta strep (group b only)  . GC/Chlamydia Probe Amp  . POCT urinalysis dipstick    Plan:  Continued routine obstetrical care,   Return in about 1 week (around 12/16/2016) for LROB.

## 2016-12-10 ENCOUNTER — Encounter: Payer: Self-pay | Admitting: Advanced Practice Midwife

## 2016-12-11 ENCOUNTER — Encounter (HOSPITAL_COMMUNITY): Payer: Self-pay | Admitting: *Deleted

## 2016-12-11 ENCOUNTER — Inpatient Hospital Stay (HOSPITAL_COMMUNITY)
Admission: AD | Admit: 2016-12-11 | Discharge: 2016-12-11 | Disposition: A | Discharge status: Home / Self Care | Payer: BLUE CROSS/BLUE SHIELD | Source: Ambulatory Visit | Attending: Obstetrics and Gynecology | Admitting: Obstetrics and Gynecology

## 2016-12-11 DIAGNOSIS — Z3A37 37 weeks gestation of pregnancy: Secondary | ICD-10-CM | POA: Diagnosis not present

## 2016-12-11 DIAGNOSIS — O99891 Other specified diseases and conditions complicating pregnancy: Secondary | ICD-10-CM

## 2016-12-11 DIAGNOSIS — Z3689 Encounter for other specified antenatal screening: Secondary | ICD-10-CM | POA: Diagnosis not present

## 2016-12-11 DIAGNOSIS — R42 Dizziness and giddiness: Secondary | ICD-10-CM | POA: Diagnosis not present

## 2016-12-11 DIAGNOSIS — M549 Dorsalgia, unspecified: Secondary | ICD-10-CM

## 2016-12-11 DIAGNOSIS — Z833 Family history of diabetes mellitus: Secondary | ICD-10-CM | POA: Diagnosis not present

## 2016-12-11 DIAGNOSIS — O26893 Other specified pregnancy related conditions, third trimester: Secondary | ICD-10-CM | POA: Diagnosis not present

## 2016-12-11 DIAGNOSIS — R1032 Left lower quadrant pain: Secondary | ICD-10-CM | POA: Insufficient documentation

## 2016-12-11 DIAGNOSIS — Z8249 Family history of ischemic heart disease and other diseases of the circulatory system: Secondary | ICD-10-CM | POA: Insufficient documentation

## 2016-12-11 DIAGNOSIS — M545 Low back pain: Secondary | ICD-10-CM | POA: Diagnosis present

## 2016-12-11 DIAGNOSIS — Z800 Family history of malignant neoplasm of digestive organs: Secondary | ICD-10-CM | POA: Insufficient documentation

## 2016-12-11 DIAGNOSIS — H9202 Otalgia, left ear: Secondary | ICD-10-CM | POA: Diagnosis not present

## 2016-12-11 DIAGNOSIS — Z9889 Other specified postprocedural states: Secondary | ICD-10-CM | POA: Diagnosis not present

## 2016-12-11 DIAGNOSIS — Z841 Family history of disorders of kidney and ureter: Secondary | ICD-10-CM | POA: Insufficient documentation

## 2016-12-11 DIAGNOSIS — R109 Unspecified abdominal pain: Secondary | ICD-10-CM

## 2016-12-11 DIAGNOSIS — O9989 Other specified diseases and conditions complicating pregnancy, childbirth and the puerperium: Secondary | ICD-10-CM | POA: Diagnosis not present

## 2016-12-11 LAB — GC/CHLAMYDIA PROBE AMP
Chlamydia trachomatis, NAA: NEGATIVE
Neisseria gonorrhoeae by PCR: NEGATIVE

## 2016-12-11 LAB — URINALYSIS, ROUTINE W REFLEX MICROSCOPIC
Bilirubin Urine: NEGATIVE
Glucose, UA: NEGATIVE mg/dL
Hgb urine dipstick: NEGATIVE
Ketones, ur: NEGATIVE mg/dL
Nitrite: NEGATIVE
Protein, ur: NEGATIVE mg/dL
Specific Gravity, Urine: 1.017 (ref 1.005–1.030)
pH: 6 (ref 5.0–8.0)

## 2016-12-11 LAB — GLUCOSE, CAPILLARY: Glucose-Capillary: 87 mg/dL (ref 65–99)

## 2016-12-11 NOTE — Discharge Instructions (Signed)
Abdominal Pain During Pregnancy °Belly (abdominal) pain is common during pregnancy. Most of the time, it is not a serious problem. Other times, it can be a sign that something is wrong with the pregnancy. Always tell your doctor if you have belly pain. °Follow these instructions at home: °Monitor your belly pain for any changes. The following actions may help you feel better: °· Do not have sex (intercourse) or put anything in your vagina until you feel better. °· Rest until your pain stops. °· Drink clear fluids if you feel sick to your stomach (nauseous). Do not eat solid food until you feel better. °· Only take medicine as told by your doctor. °· Keep all doctor visits as told. °Get help right away if: °· You are bleeding, leaking fluid, or pieces of tissue come out of your vagina. °· You have more pain or cramping. °· You keep throwing up (vomiting). °· You have pain when you pee (urinate) or have blood in your pee. °· You have a fever. °· You do not feel your baby moving as much. °· You feel very weak or feel like passing out. °· You have trouble breathing, with or without belly pain. °· You have a very bad headache and belly pain. °· You have fluid leaking from your vagina and belly pain. °· You keep having watery poop (diarrhea). °· Your belly pain does not go away after resting, or the pain gets worse. °This information is not intended to replace advice given to you by your health care provider. Make sure you discuss any questions you have with your health care provider. °Document Released: 07/15/2009 Document Revised: 03/04/2016 Document Reviewed: 02/23/2013 °Elsevier Interactive Patient Education © 2017 [employer] Inc. ° °

## 2016-12-11 NOTE — MAU Note (Addendum)
Pt presents to MAU with complaints of lower abdominal pain, back pain, dizziness and pressure in her left ear for a couple of days. Denies any VB or LOF Pt states that she did find a tick on her Wednesday night

## 2016-12-11 NOTE — MAU Provider Note (Signed)
History     CSN: [id number]  Arrival date and time: 12/11/16 1642   None     Chief Complaint  Patient presents with  . ear pressure  . Dizziness  . Back Pain  . Abdominal Pain   G2P1001 @37 .2 wks here with lightheadedness, ear pressure and pain, back pain, and LLQ pain. Ear pain started last night. Describes as pain and fullness. No nasal congestion or PN drip. She reports a dry cough x4 weeks. She uses Claritin daily. No SOB. No fevers. Lightheadedness started today. No LOC. She is eating and drinking well. Reports lower back pain for 2 weeks. No urinary sx. Used Tylenol and heat and had relief. Some LLQ pain x2 days. No constipation. Good FM. No LOF, VB, or regular ctx. Of note she reports finding a tick on her abdomen earlier this week that was easy to remove.    OB History    Gravida Para Term Preterm AB Living   2 1 1     1    SAB TAB Ectopic Multiple Live Births           1      Past Medical History:  Diagnosis Date  . Medical history non-contributory     Past Surgical History:  Procedure Laterality Date  . TONSILLECTOMY      Family History  Problem Relation Age of Onset  . Cancer Maternal Grandmother     colon  . Diabetes Maternal Grandmother   . Kidney disease Maternal Grandmother   . Hypertension Father   . Hypertension Mother   . Diabetes Mother   . Kidney disease Mother     Social History  Substance Use Topics  . Smoking status: Never Smoker  . Smokeless tobacco: Never Used  . Alcohol use No    Allergies: No Known Allergies  Prescriptions Prior to Admission  Medication Sig Dispense Refill Last Dose  . acetaminophen (TYLENOL) 500 MG tablet Take 1,000 mg by mouth every 6 (six) hours as needed for moderate pain.   12/11/2016 at Unknown time  . ondansetron (ZOFRAN) 4 MG tablet Take 4 mg by mouth every 6 (six) hours as needed for nausea.   0 Past Month at Unknown time  . Prenatal Vit-Fe Fumarate-FA (PRENATAL VITAMIN PO) Take 1 tablet by mouth daily.     12/11/2016 at Unknown time    Review of Systems  Constitutional: Negative for fever.  HENT: Positive for ear pain. Negative for ear discharge, hearing loss, rhinorrhea and sore throat.   Respiratory: Positive for cough. Negative for shortness of breath.   Gastrointestinal: Positive for abdominal pain.  Genitourinary: Negative for vaginal bleeding.  Musculoskeletal: Positive for back pain.  Neurological: Positive for light-headedness.   Physical Exam   Blood pressure 131/79, pulse 86, temperature 98.3 F (36.8 C), resp. rate 18, height 5\' 2"  (1.575 m), weight 126.1 kg (278 lb), last menstrual period 04/03/2016.  Physical Exam  Nursing note and vitals reviewed. Constitutional: She is oriented to person, place, and time. She appears well-developed and well-nourished. No distress.  HENT:  Head: Normocephalic and atraumatic.  Neck: Normal range of motion.  Cardiovascular: Normal rate.   Respiratory: Effort normal.  GI: Soft. She exhibits no distension. There is no tenderness.  gravid  Musculoskeletal: Normal range of motion.  Neurological: She is alert and oriented to person, place, and time.  Skin: Skin is warm and dry.  Psychiatric: She has a normal mood and affect.   EFM: 135 bpm, mod variability, +  accels, no decels Toco: none  Results for orders placed or performed during the hospital encounter of 12/11/16 (from the past 24 hour(s))  Urinalysis, Routine w reflex microscopic     Status: Abnormal   Collection Time: 12/11/16  4:58 PM  Result Value Ref Range   Color, Urine YELLOW YELLOW   APPearance HAZY (A) CLEAR   Specific Gravity, Urine 1.017 1.005 - 1.030   pH 6.0 5.0 - 8.0   Glucose, UA NEGATIVE NEGATIVE mg/dL   Hgb urine dipstick NEGATIVE NEGATIVE   Bilirubin Urine NEGATIVE NEGATIVE   Ketones, ur NEGATIVE NEGATIVE mg/dL   Protein, ur NEGATIVE NEGATIVE mg/dL   Nitrite NEGATIVE NEGATIVE   Leukocytes, UA TRACE (A) NEGATIVE   RBC / HPF 0-5 0 - 5 RBC/hpf   WBC, UA 0-5 0  - 5 WBC/hpf   Bacteria, UA RARE (A) NONE SEEN   Squamous Epithelial / LPF 6-30 (A) NONE SEEN   Mucous PRESENT   Glucose, capillary     Status: None   Collection Time: 12/11/16  5:44 PM  Result Value Ref Range   Glucose-Capillary 87 65 - 99 mg/dL   MAU Course  Procedures Orthostatic VS CBG  MDM Labs ordered and reviewed. VSS-not orthostatic. CBG nml. No evidence of ear infection, sx likely caused by virus or allergies. Back pain likely MSK, no evidence of UTI or pyelo. Stable for discharge home.  Assessment and Plan   1. [redacted] weeks gestation of pregnancy   2. NST (non-stress test) reactive   3. Back pain affecting pregnancy in third trimester   4. Lightheadedness   5. Ear pain, left   6. Abdominal pain during pregnancy in third trimester    Discharge home Follow up in office as scheduled next week Increase water intake Tylenol prn Heat prn Return for worsening sx  Allergies as of 12/11/2016   No Known Allergies     Medication List    TAKE these medications   acetaminophen 500 MG tablet Commonly known as:  TYLENOL Take 1,000 mg by mouth every 6 (six) hours as needed for moderate pain.   ondansetron 4 MG tablet Commonly known as:  ZOFRAN Take 4 mg by mouth every 6 (six) hours as needed for nausea.   PRENATAL VITAMIN PO Take 1 tablet by mouth daily.     ' Caiya Bettes, CNM 12/11/2016, 5:42 PM

## 2016-12-13 LAB — CULTURE, BETA STREP (GROUP B ONLY): Strep Gp B Culture: NEGATIVE

## 2016-12-16 ENCOUNTER — Ambulatory Visit (INDEPENDENT_AMBULATORY_CARE_PROVIDER_SITE_OTHER): Payer: BLUE CROSS/BLUE SHIELD | Admitting: Advanced Practice Midwife

## 2016-12-16 ENCOUNTER — Encounter: Payer: Self-pay | Admitting: Advanced Practice Midwife

## 2016-12-16 VITALS — BP 138/70 | HR 70 | Wt 279.5 lb

## 2016-12-16 DIAGNOSIS — Z1389 Encounter for screening for other disorder: Secondary | ICD-10-CM

## 2016-12-16 DIAGNOSIS — Z331 Pregnant state, incidental: Secondary | ICD-10-CM

## 2016-12-16 DIAGNOSIS — Z3483 Encounter for supervision of other normal pregnancy, third trimester: Secondary | ICD-10-CM

## 2016-12-16 LAB — POCT URINALYSIS DIPSTICK
Glucose, UA: NEGATIVE
Nitrite, UA: NEGATIVE

## 2016-12-16 NOTE — Patient Instructions (Signed)

## 2016-12-16 NOTE — Progress Notes (Signed)
G2P1001 5129w0d Estimated Date of Delivery: 12/30/16  Blood pressure 138/70, pulse 70, weight 279 lb 8 oz (126.8 kg), last menstrual period 04/03/2016.   BP weight and urine results all reviewed and noted.  Please refer to the obstetrical flow sheet for the fundal height and fetal heart rate documentation:  Patient reports good fetal movement, denies any bleeding and no rupture of membranes symptoms or regular contractions. Patient is without complaints. All questions were answered.  Orders Placed This Encounter  Procedures  . POCT Urinalysis Dipstick    Plan:  Continued routine obstetrical care.  Return in about 1 week (around 12/23/2016) for LROB.   Jonetta Speakarol Haruko Mersch

## 2016-12-16 NOTE — Progress Notes (Signed)
G2P1001 5489w0d Estimated Date of Delivery: 12/30/16  Blood pressure 138/70, pulse 70, weight 279 lb 8 oz (126.8 kg), last menstrual period 04/03/2016.   BP weight and urine results all reviewed and noted.  Please refer to the obstetrical flow sheet for the fundal height and fetal heart rate documentation:  Patient reports good fetal movement, denies any bleeding and no rupture of membranes symptoms or regular contractions. Patient is without complaints. All questions were answered.  Orders Placed This Encounter  Procedures  . POCT Urinalysis Dipstick    Plan:  Continued routine obstetrical care,   Return in about 1 week (around 12/23/2016) for LROB.

## 2016-12-21 ENCOUNTER — Inpatient Hospital Stay (HOSPITAL_COMMUNITY)
Admission: AD | Admit: 2016-12-21 | Discharge: 2016-12-21 | Disposition: A | Discharge status: Home / Self Care | Payer: BLUE CROSS/BLUE SHIELD | Source: Ambulatory Visit | Attending: Obstetrics and Gynecology | Admitting: Obstetrics and Gynecology

## 2016-12-21 ENCOUNTER — Encounter (HOSPITAL_COMMUNITY): Payer: Self-pay

## 2016-12-21 DIAGNOSIS — Z3A00 Weeks of gestation of pregnancy not specified: Secondary | ICD-10-CM | POA: Insufficient documentation

## 2016-12-21 DIAGNOSIS — O471 False labor at or after 37 completed weeks of gestation: Secondary | ICD-10-CM | POA: Diagnosis not present

## 2016-12-21 HISTORY — DX: Essential (primary) hypertension: I10

## 2016-12-21 NOTE — MAU Note (Signed)
Pt has been feeling pain in her back that goes to her abdomen and tightening in her abdomen since last night.

## 2016-12-22 ENCOUNTER — Encounter: Payer: Self-pay | Admitting: Advanced Practice Midwife

## 2016-12-23 ENCOUNTER — Encounter: Payer: BLUE CROSS/BLUE SHIELD | Admitting: Obstetrics and Gynecology

## 2016-12-23 ENCOUNTER — Encounter: Payer: Self-pay | Admitting: *Deleted

## 2016-12-24 ENCOUNTER — Encounter: Payer: Self-pay | Admitting: Obstetrics & Gynecology

## 2016-12-24 ENCOUNTER — Ambulatory Visit (INDEPENDENT_AMBULATORY_CARE_PROVIDER_SITE_OTHER): Payer: BLUE CROSS/BLUE SHIELD | Admitting: Obstetrics & Gynecology

## 2016-12-24 VITALS — BP 134/70 | HR 84 | Wt 282.0 lb

## 2016-12-24 DIAGNOSIS — Z3483 Encounter for supervision of other normal pregnancy, third trimester: Secondary | ICD-10-CM

## 2016-12-24 DIAGNOSIS — Z331 Pregnant state, incidental: Secondary | ICD-10-CM

## 2016-12-24 DIAGNOSIS — Z1389 Encounter for screening for other disorder: Secondary | ICD-10-CM

## 2016-12-24 NOTE — Progress Notes (Signed)
G2P1001 4111w1d Estimated Date of Delivery: 12/30/16  Blood pressure 134/70, pulse 84, weight 282 lb (127.9 kg), last menstrual period 04/03/2016.   BP weight and urine results all reviewed and noted.  Please refer to the obstetrical flow sheet for the fundal height and fetal heart rate documentation:  Patient reports good fetal movement, denies any bleeding and no rupture of membranes symptoms or regular contractions. Patient is without complaints. All questions were answered.  Orders Placed This Encounter  Procedures  . POCT urinalysis dipstick    Plan:  Continued routine obstetrical care, membranes stripped cx 2-3/50/-2  Return in about 1 week (around 12/31/2016) for LROB.

## 2016-12-25 ENCOUNTER — Encounter (HOSPITAL_COMMUNITY): Payer: Self-pay

## 2016-12-25 ENCOUNTER — Inpatient Hospital Stay (HOSPITAL_COMMUNITY)
Admission: AD | Admit: 2016-12-25 | Discharge: 2016-12-25 | Disposition: A | Discharge status: Home / Self Care | Payer: BLUE CROSS/BLUE SHIELD | Source: Ambulatory Visit | Attending: Obstetrics & Gynecology | Admitting: Obstetrics & Gynecology

## 2016-12-25 DIAGNOSIS — O479 False labor, unspecified: Secondary | ICD-10-CM | POA: Diagnosis present

## 2016-12-25 DIAGNOSIS — Z3A09 9 weeks gestation of pregnancy: Secondary | ICD-10-CM | POA: Insufficient documentation

## 2016-12-25 NOTE — Discharge Instructions (Signed)

## 2016-12-25 NOTE — MAU Note (Signed)
Patient presents with onset of contractions after having her membranes swept in the office yesterday, having a lot of blood when she wipes

## 2016-12-25 NOTE — Progress Notes (Signed)
I have communicated with Dr. Omer JackMumaw and reviewed vital signs:  Vitals:   12/25/16 0831  BP: 118/81  Pulse: (!) 112  Resp: 18  Temp: 98.2 F (36.8 C)    Vaginal exam:  Dilation: 2.5 Effacement (%): 70 Cervical Position: Middle Station: -2 Presentation: Vertex Exam by:: Laurell Josephsheryl Shameika Speelman RN ,   Also reviewed contraction pattern and that non-stress test is reactive.  It has been documented that patient is having irregular contractions, mainly UI with minimal cervical change since yesterday not indicating active labor.  Patient denies any other complaints.  Based on this report provider has given order for discharge.  A discharge order and diagnosis entered by a provider.   Labor discharge instructions reviewed with patient.

## 2016-12-25 NOTE — MAU Note (Signed)
Urine in lab 

## 2016-12-29 ENCOUNTER — Inpatient Hospital Stay (HOSPITAL_COMMUNITY): Payer: BLUE CROSS/BLUE SHIELD | Admitting: Anesthesiology

## 2016-12-29 ENCOUNTER — Inpatient Hospital Stay (HOSPITAL_COMMUNITY)
Admission: AD | Admit: 2016-12-29 | Discharge: 2016-12-30 | DRG: 767 | Disposition: A | Discharge status: Home / Self Care | Payer: BLUE CROSS/BLUE SHIELD | Source: Ambulatory Visit | Attending: Obstetrics & Gynecology | Admitting: Obstetrics & Gynecology

## 2016-12-29 ENCOUNTER — Encounter (HOSPITAL_COMMUNITY)
Admission: AD | Disposition: A | Discharge status: Home / Self Care | Payer: Self-pay | Source: Ambulatory Visit | Attending: Obstetrics & Gynecology

## 2016-12-29 ENCOUNTER — Encounter (HOSPITAL_COMMUNITY): Payer: Self-pay

## 2016-12-29 ENCOUNTER — Encounter: Payer: Self-pay | Admitting: *Deleted

## 2016-12-29 DIAGNOSIS — Z6841 Body Mass Index (BMI) 40.0 and over, adult: Secondary | ICD-10-CM

## 2016-12-29 DIAGNOSIS — Z3A39 39 weeks gestation of pregnancy: Secondary | ICD-10-CM

## 2016-12-29 DIAGNOSIS — Z302 Encounter for sterilization: Secondary | ICD-10-CM

## 2016-12-29 DIAGNOSIS — Z833 Family history of diabetes mellitus: Secondary | ICD-10-CM | POA: Diagnosis not present

## 2016-12-29 DIAGNOSIS — O4202 Full-term premature rupture of membranes, onset of labor within 24 hours of rupture: Secondary | ICD-10-CM | POA: Diagnosis present

## 2016-12-29 DIAGNOSIS — O99214 Obesity complicating childbirth: Secondary | ICD-10-CM | POA: Diagnosis present

## 2016-12-29 DIAGNOSIS — Z8249 Family history of ischemic heart disease and other diseases of the circulatory system: Secondary | ICD-10-CM

## 2016-12-29 HISTORY — PX: TUBAL LIGATION: SHX77

## 2016-12-29 LAB — POCT FERN TEST: POCT Fern Test: POSITIVE

## 2016-12-29 LAB — CBC
HCT: 30.4 % — ABNORMAL LOW (ref 36.0–46.0)
Hemoglobin: 10.5 g/dL — ABNORMAL LOW (ref 12.0–15.0)
MCH: 31.5 pg (ref 26.0–34.0)
MCHC: 34.5 g/dL (ref 30.0–36.0)
MCV: 91.3 fL (ref 78.0–100.0)
Platelets: 225 10*3/uL (ref 150–400)
RBC: 3.33 MIL/uL — ABNORMAL LOW (ref 3.87–5.11)
RDW: 13.9 % (ref 11.5–15.5)
WBC: 9.6 10*3/uL (ref 4.0–10.5)

## 2016-12-29 LAB — RPR: RPR Ser Ql: NONREACTIVE

## 2016-12-29 LAB — TYPE AND SCREEN
ABO/RH(D): O POS
Antibody Screen: NEGATIVE

## 2016-12-29 LAB — ABO/RH: ABO/RH(D): O POS

## 2016-12-29 SURGERY — LIGATION, FALLOPIAN TUBE, POSTPARTUM
Anesthesia: Epidural

## 2016-12-29 MED ORDER — PRENATAL MULTIVITAMIN CH
1.0000 | ORAL_TABLET | Freq: Every day | ORAL | Status: DC
  Filled 2016-12-29: qty 1

## 2016-12-29 MED ORDER — ONDANSETRON HCL 4 MG/2ML IJ SOLN
4.0000 mg | INTRAMUSCULAR | Status: DC | PRN
Start: 2016-12-29 — End: 2016-12-30

## 2016-12-29 MED ORDER — ONDANSETRON HCL 4 MG PO TABS: 4.0000 mg | ORAL_TABLET | ORAL | Status: DC | PRN

## 2016-12-29 MED ORDER — FENTANYL 2.5 MCG/ML BUPIVACAINE 1/10 % EPIDURAL INFUSION (WH - ANES): 14.0000 mL/h | INTRAMUSCULAR | Status: DC | PRN

## 2016-12-29 MED ORDER — BUPIVACAINE HCL (PF) 0.25 % IJ SOLN
INTRAMUSCULAR | Status: DC | PRN
  Administered 2016-12-29: 30 mL

## 2016-12-29 MED ORDER — MIDAZOLAM HCL 5 MG/5ML IJ SOLN
INTRAMUSCULAR | Status: DC | PRN
  Administered 2016-12-29: 2 mg via INTRAVENOUS

## 2016-12-29 MED ORDER — LIDOCAINE HCL (PF) 1 % IJ SOLN
INTRAMUSCULAR | Status: DC | PRN
  Administered 2016-12-29 (×2): 5 mL via EPIDURAL

## 2016-12-29 MED ORDER — LACTATED RINGERS IV SOLN
INTRAVENOUS | Status: DC | PRN
  Administered 2016-12-29: 13:00:00 via INTRAVENOUS

## 2016-12-29 MED ORDER — LIDOCAINE HCL (PF) 1 % IJ SOLN
30.0000 mL | INTRAMUSCULAR | Status: DC | PRN
  Administered 2016-12-29: 30 mL via SUBCUTANEOUS
  Filled 2016-12-29: qty 30

## 2016-12-29 MED ORDER — DIPHENHYDRAMINE HCL 50 MG/ML IJ SOLN: 12.5000 mg | INTRAMUSCULAR | Status: DC | PRN

## 2016-12-29 MED ORDER — TETANUS-DIPHTH-ACELL PERTUSSIS 5-2.5-18.5 LF-MCG/0.5 IM SUSP: 0.5000 mL | Freq: Once | INTRAMUSCULAR | Status: DC

## 2016-12-29 MED ORDER — ZOLPIDEM TARTRATE 5 MG PO TABS: 5.0000 mg | ORAL_TABLET | Freq: Every evening | ORAL | Status: DC | PRN

## 2016-12-29 MED ORDER — DIBUCAINE 1 % RE OINT: 1.0000 "application " | TOPICAL_OINTMENT | RECTAL | Status: DC | PRN

## 2016-12-29 MED ORDER — ACETAMINOPHEN 325 MG PO TABS: 650.0000 mg | ORAL_TABLET | ORAL | Status: DC | PRN

## 2016-12-29 MED ORDER — OXYTOCIN 40 UNITS IN LACTATED RINGERS INFUSION - SIMPLE MED: 2.5000 [IU]/h | INTRAVENOUS | Status: DC

## 2016-12-29 MED ORDER — COCONUT OIL OIL: 1.0000 "application " | TOPICAL_OIL | Status: DC | PRN

## 2016-12-29 MED ORDER — LACTATED RINGERS IV SOLN: 500.0000 mL | Freq: Once | INTRAVENOUS | Status: DC

## 2016-12-29 MED ORDER — BENZOCAINE-MENTHOL 20-0.5 % EX AERO
1.0000 "application " | INHALATION_SPRAY | CUTANEOUS | Status: DC | PRN
  Filled 2016-12-29: qty 56

## 2016-12-29 MED ORDER — DIPHENHYDRAMINE HCL 25 MG PO CAPS: 25.0000 mg | ORAL_CAPSULE | Freq: Four times a day (QID) | ORAL | Status: DC | PRN

## 2016-12-29 MED ORDER — BENZOCAINE-MENTHOL 20-0.5 % EX AERO
1.0000 "application " | INHALATION_SPRAY | CUTANEOUS | Status: DC | PRN
  Administered 2016-12-30: 1 via TOPICAL

## 2016-12-29 MED ORDER — ONDANSETRON HCL 4 MG/2ML IJ SOLN: 4.0000 mg | Freq: Four times a day (QID) | INTRAMUSCULAR | Status: DC | PRN

## 2016-12-29 MED ORDER — FENTANYL CITRATE (PF) 100 MCG/2ML IJ SOLN: 100.0000 ug | INTRAMUSCULAR | Status: DC | PRN

## 2016-12-29 MED ORDER — EPHEDRINE 5 MG/ML INJ: 10.0000 mg | INTRAVENOUS | Status: DC | PRN

## 2016-12-29 MED ORDER — FLEET ENEMA 7-19 GM/118ML RE ENEM: 1.0000 | ENEMA | RECTAL | Status: DC | PRN

## 2016-12-29 MED ORDER — FENTANYL CITRATE (PF) 100 MCG/2ML IJ SOLN
INTRAMUSCULAR | Status: DC | PRN
  Administered 2016-12-29: 100 ug via INTRAVENOUS

## 2016-12-29 MED ORDER — IBUPROFEN 600 MG PO TABS
600.0000 mg | ORAL_TABLET | Freq: Four times a day (QID) | ORAL | Status: DC
  Filled 2016-12-29 (×3): qty 1

## 2016-12-29 MED ORDER — OXYCODONE-ACETAMINOPHEN 5-325 MG PO TABS: 2.0000 | ORAL_TABLET | ORAL | Status: DC | PRN

## 2016-12-29 MED ORDER — PHENYLEPHRINE 40 MCG/ML (10ML) SYRINGE FOR IV PUSH (FOR BLOOD PRESSURE SUPPORT)
80.0000 ug | PREFILLED_SYRINGE | INTRAVENOUS | Status: DC | PRN
  Filled 2016-12-29: qty 10

## 2016-12-29 MED ORDER — WITCH HAZEL-GLYCERIN EX PADS: 1.0000 "application " | MEDICATED_PAD | CUTANEOUS | Status: DC | PRN

## 2016-12-29 MED ORDER — MEASLES, MUMPS & RUBELLA VAC ~~LOC~~ INJ
0.5000 mL | INJECTION | Freq: Once | SUBCUTANEOUS | Status: DC
  Filled 2016-12-29: qty 0.5

## 2016-12-29 MED ORDER — SOD CITRATE-CITRIC ACID 500-334 MG/5ML PO SOLN
30.0000 mL | ORAL | Status: DC | PRN
  Administered 2016-12-29: 30 mL via ORAL
  Filled 2016-12-29: qty 15

## 2016-12-29 MED ORDER — FENTANYL 2.5 MCG/ML BUPIVACAINE 1/10 % EPIDURAL INFUSION (WH - ANES)
14.0000 mL/h | INTRAMUSCULAR | Status: DC | PRN
  Administered 2016-12-29: 14 mL/h via EPIDURAL
  Filled 2016-12-29: qty 100

## 2016-12-29 MED ORDER — OXYTOCIN BOLUS FROM INFUSION
500.0000 mL | Freq: Once | INTRAVENOUS | Status: AC
  Administered 2016-12-29: 500 mL/h via INTRAVENOUS

## 2016-12-29 MED ORDER — SODIUM BICARBONATE 8.4 % IV SOLN
INTRAVENOUS | Status: AC
  Filled 2016-12-29: qty 50

## 2016-12-29 MED ORDER — LACTATED RINGERS IV SOLN
INTRAVENOUS | Status: DC
  Administered 2016-12-29: 03:00:00 via INTRAVENOUS

## 2016-12-29 MED ORDER — SIMETHICONE 80 MG PO CHEW: 80.0000 mg | CHEWABLE_TABLET | ORAL | Status: DC | PRN

## 2016-12-29 MED ORDER — LACTATED RINGERS IV SOLN
500.0000 mL | INTRAVENOUS | Status: DC | PRN
  Administered 2016-12-29: 300 mL via INTRAVENOUS

## 2016-12-29 MED ORDER — BUPIVACAINE HCL (PF) 0.25 % IJ SOLN
INTRAMUSCULAR | Status: AC
  Filled 2016-12-29: qty 30

## 2016-12-29 MED ORDER — PHENYLEPHRINE 40 MCG/ML (10ML) SYRINGE FOR IV PUSH (FOR BLOOD PRESSURE SUPPORT): 80.0000 ug | PREFILLED_SYRINGE | INTRAVENOUS | Status: DC | PRN

## 2016-12-29 MED ORDER — MIDAZOLAM HCL 2 MG/2ML IJ SOLN
INTRAMUSCULAR | Status: AC
  Filled 2016-12-29: qty 2

## 2016-12-29 MED ORDER — ONDANSETRON HCL 4 MG/2ML IJ SOLN: 4.0000 mg | INTRAMUSCULAR | Status: DC | PRN

## 2016-12-29 MED ORDER — PRENATAL MULTIVITAMIN CH: 1.0000 | ORAL_TABLET | Freq: Every day | ORAL | Status: DC

## 2016-12-29 MED ORDER — OXYTOCIN 40 UNITS IN LACTATED RINGERS INFUSION - SIMPLE MED
1.0000 m[IU]/min | INTRAVENOUS | Status: DC
Start: 2016-12-29 — End: 2016-12-29
  Administered 2016-12-29: 4 m[IU]/min via INTRAVENOUS
  Administered 2016-12-29: 2 m[IU]/min via INTRAVENOUS
  Filled 2016-12-29: qty 1000

## 2016-12-29 MED ORDER — IBUPROFEN 600 MG PO TABS
600.0000 mg | ORAL_TABLET | Freq: Four times a day (QID) | ORAL | Status: DC
  Administered 2016-12-29 – 2016-12-30 (×3): 600 mg via ORAL
  Filled 2016-12-29 (×2): qty 1

## 2016-12-29 MED ORDER — OXYCODONE-ACETAMINOPHEN 5-325 MG PO TABS: 1.0000 | ORAL_TABLET | ORAL | Status: DC | PRN

## 2016-12-29 MED ORDER — SENNOSIDES-DOCUSATE SODIUM 8.6-50 MG PO TABS
2.0000 | ORAL_TABLET | ORAL | Status: DC
  Administered 2016-12-30: 2 via ORAL
  Filled 2016-12-29: qty 2

## 2016-12-29 MED ORDER — TERBUTALINE SULFATE 1 MG/ML IJ SOLN: 0.2500 mg | Freq: Once | INTRAMUSCULAR | Status: DC | PRN

## 2016-12-29 MED ORDER — FENTANYL CITRATE (PF) 100 MCG/2ML IJ SOLN
INTRAMUSCULAR | Status: AC
  Filled 2016-12-29: qty 2

## 2016-12-29 MED ORDER — SODIUM BICARBONATE 8.4 % IV SOLN
INTRAVENOUS | Status: DC | PRN
  Administered 2016-12-29 (×4): 5 mL via EPIDURAL

## 2016-12-29 MED ORDER — FENTANYL CITRATE (PF) 100 MCG/2ML IJ SOLN: 25.0000 ug | INTRAMUSCULAR | Status: DC | PRN

## 2016-12-29 MED ORDER — SENNOSIDES-DOCUSATE SODIUM 8.6-50 MG PO TABS: 2.0000 | ORAL_TABLET | ORAL | Status: DC

## 2016-12-29 SURGICAL SUPPLY — 23 items
BLADE SURG 11 STRL SS (BLADE) ×2 IMPLANT
CLIP FILSHIE TUBAL LIGA STRL (Clip) ×4 IMPLANT
CLOTH BEACON ORANGE TIMEOUT ST (SAFETY) ×2 IMPLANT
DRESSING OPSITE X SMALL 2X3 (GAUZE/BANDAGES/DRESSINGS) ×2 IMPLANT
DRSG OPSITE POSTOP 3X4 (GAUZE/BANDAGES/DRESSINGS) ×2 IMPLANT
DURAPREP 26ML APPLICATOR (WOUND CARE) ×2 IMPLANT
GLOVE BIOGEL PI IND STRL 7.0 (GLOVE) ×1 IMPLANT
GLOVE BIOGEL PI IND STRL 7.5 (GLOVE) ×1 IMPLANT
GLOVE BIOGEL PI INDICATOR 7.0 (GLOVE) ×1
GLOVE BIOGEL PI INDICATOR 7.5 (GLOVE) ×1
GLOVE ECLIPSE 7.5 STRL STRAW (GLOVE) ×2 IMPLANT
GOWN STRL REUS W/TWL LRG LVL3 (GOWN DISPOSABLE) ×4 IMPLANT
NEEDLE HYPO 22GX1.5 SAFETY (NEEDLE) ×2 IMPLANT
NS IRRIG 1000ML POUR BTL (IV SOLUTION) ×2 IMPLANT
PACK ABDOMINAL MINOR (CUSTOM PROCEDURE TRAY) ×2 IMPLANT
PROTECTOR NERVE ULNAR (MISCELLANEOUS) ×2 IMPLANT
SPONGE LAP 4X18 X RAY DECT (DISPOSABLE) IMPLANT
SUT VICRYL 0 UR6 27IN ABS (SUTURE) ×2 IMPLANT
SUT VICRYL 4-0 PS2 18IN ABS (SUTURE) ×2 IMPLANT
SYR CONTROL 10ML LL (SYRINGE) ×2 IMPLANT
TOWEL OR 17X24 6PK STRL BLUE (TOWEL DISPOSABLE) ×2 IMPLANT
TRAY FOLEY CATH SILVER 14FR (SET/KITS/TRAYS/PACK) ×2 IMPLANT
WATER STERILE IRR 1000ML POUR (IV SOLUTION) ×2 IMPLANT

## 2016-12-29 NOTE — Anesthesia Pain Management Evaluation Note (Signed)
  CRNA Pain Management Visit Note  Patient: Kari Maddox, 31 y.o., female  "Hello I am a member of the anesthesia [facility] at [facility]. We have an anesthesia team available at all times to provide care throughout the hospital, including epidural management and anesthesia for C-section. I don't know your plan for the delivery whether it a natural birth, water birth, IV sedation, nitrous supplementation, doula or epidural, but we want to meet your pain goals."   1.Was your pain managed to your expectations on prior hospitalizations?   Yes   2.What is your expectation for pain management during this hospitalization?     Epidural  3.How can we help you reach that goal? epidural  Record the patient's initial score and the patient's pain goal.   Pain: 0  Pain Goal: 5 [facility] wants you to be able to say your pain was always managed very well.  [name] 12/29/2016

## 2016-12-29 NOTE — Progress Notes (Signed)
Patient ID: Kari Maddox, female   DOB: 01/15/1986, 31 y.o.   MRN: [id number]  Risks of procedure discussed with patient including but not limited to: risk of regret, permanence of method, bleeding, infection, injury to surrounding organs and need for additional procedures.  Failure risk of 1 -2 % with increased risk of ectopic gestation if pregnancy occurs was also discussed with patient.    Levie HeritageStinson, Jacob J, DO 12/29/2016 10:58 AM

## 2016-12-29 NOTE — Anesthesia Preprocedure Evaluation (Signed)
Anesthesia Evaluation  Patient identified by MRN, date of birth, ID band Patient awake    Reviewed: Allergy & Precautions, H&P , NPO status , Patient's Chart, lab work & pertinent test results, reviewed documented beta blocker date and time   Airway Mallampati: II  TM Distance: >3 FB Neck ROM: full    Dental no notable dental hx.    Pulmonary neg pulmonary ROS,    Pulmonary exam normal breath sounds clear to auscultation       Cardiovascular hypertension,  Rhythm:regular Rate:Normal     Neuro/Psych    GI/Hepatic   Endo/Other  Morbid obesity  Renal/GU      Musculoskeletal   Abdominal   Peds  Hematology   Anesthesia Other Findings   Reproductive/Obstetrics (+) Pregnancy                            Anesthesia Physical  Anesthesia Plan  ASA: III  Anesthesia Plan: General and Epidural   Post-op Pain Management:    Induction: Intravenous  Airway Management Planned: Natural Airway  Additional Equipment:   Intra-op Plan:   Post-operative Plan:   Informed Consent: I have reviewed the patients History and Physical, chart, labs and discussed the procedure including the risks, benefits and alternatives for the proposed anesthesia with the patient or authorized representative who has indicated his/her understanding and acceptance.   Dental Advisory Given  Plan Discussed with: CRNA and Surgeon  Anesthesia Plan Comments: (  )       Anesthesia Quick Evaluation

## 2016-12-29 NOTE — Anesthesia Procedure Notes (Signed)
Epidural Patient location during procedure: OB Start time: 12/29/2016 4:21 AM End time: 12/29/2016 4:31 AM  Staffing Anesthesiologist: Chaney MallingHODIERNE, Tiegan Jambor Performed: anesthesiologist   Preanesthetic Checklist Completed: patient identified, site marked, pre-op evaluation, timeout performed, IV checked, risks and benefits discussed and monitors and equipment checked  Epidural Patient position: sitting Prep: DuraPrep Patient monitoring: heart rate, cardiac monitor, continuous pulse ox and blood pressure Approach: midline Location: L2-L3 Injection technique: LOR saline  Needle:  Needle type: Tuohy  Needle gauge: 17 G Needle length: 9 cm Needle insertion depth: 8 cm Catheter type: closed end flexible Catheter size: 19 Gauge Catheter at skin depth: 14 cm Test dose: negative and Other  Assessment Events: blood not aspirated, injection not painful, no injection resistance and negative IV test  Additional Notes Informed consent obtained prior to proceeding including risk of failure, 1% risk of PDPH, risk of minor discomfort and bruising.  Discussed rare but serious complications including epidural abscess, permanent nerve injury, epidural hematoma.  Discussed alternatives to epidural analgesia and patient desires to proceed.  Timeout performed pre-procedure verifying patient name, procedure, and platelet count.  Patient tolerated procedure well. Reason for block:procedure for pain

## 2016-12-29 NOTE — MAU Note (Signed)
Pt thinks water broke at 10pm-clear/yellowish tinge. States fluid keeps trickling. Reports occasional contractions. Has a bloody mucous. Reports good fetal movement.

## 2016-12-29 NOTE — Anesthesia Preprocedure Evaluation (Signed)
Anesthesia Evaluation  Patient identified by MRN, date of birth, ID band Patient awake    Reviewed: Allergy & Precautions, H&P , NPO status , Patient's Chart, lab work & pertinent test results  Airway Mallampati: II   Neck ROM: full    Dental   Pulmonary neg pulmonary ROS,    breath sounds clear to auscultation       Cardiovascular hypertension,  Rhythm:regular Rate:Normal     Neuro/Psych    GI/Hepatic   Endo/Other  Morbid obesity  Renal/GU      Musculoskeletal   Abdominal   Peds  Hematology   Anesthesia Other Findings   Reproductive/Obstetrics (+) Pregnancy                             Anesthesia Physical Anesthesia Plan  ASA: II  Anesthesia Plan: Epidural   Post-op Pain Management:    Induction: Intravenous  Airway Management Planned: Natural Airway  Additional Equipment:   Intra-op Plan:   Post-operative Plan:   Informed Consent: I have reviewed the patients History and Physical, chart, labs and discussed the procedure including the risks, benefits and alternatives for the proposed anesthesia with the patient or authorized representative who has indicated his/her understanding and acceptance.     Plan Discussed with: Anesthesiologist  Anesthesia Plan Comments:         Anesthesia Quick Evaluation

## 2016-12-29 NOTE — Transfer of Care (Signed)
Immediate Anesthesia Transfer of Care Note  Patient: Kari Maddox  Procedure(s) Performed: Procedure(s): POST PARTUM TUBAL LIGATION (N/A)  Patient Location: PACU  Anesthesia Type:Epidural  Level of Consciousness: awake, alert  and oriented  Airway & Oxygen Therapy: Patient Spontanous Breathing  Post-op Assessment: Report given to RN and Post -op Vital signs reviewed and stable  Post vital signs: Reviewed and stable  Last Vitals:  Vitals:   12/29/16 1132 12/29/16 1147  BP: 108/86 130/70  Pulse: 78 76  Resp: 18   Temp:      Last Pain:  Vitals:   12/29/16 1132  TempSrc:   PainSc: 0-No pain         Complications: No apparent anesthesia complications

## 2016-12-29 NOTE — H&P (Signed)
Kari LongestBrandy N Maddox is a 31 y.o. female G2P1001 at 39.6 weeks presenting for spontaneous rupture of membranes. She reports a gush of clear fluid at 2200. She denies contractions or vaginal bleeding and reports good fetal movement.    Clinic [facility]  Initiated Care at  11wks  FOB Barnabas HarriesMark Heaps  Dating By 10wk u/s  Pap 11/13/15, neg  GC/CT Initial:   -/-             36+wks:-/-  Genetic Screen NT/IT: neg  CF screen declined  Anatomic US Normal female 'Gunner'  Flu vaccine W/ PCP 04/07/16  Tdap Recommended ~ 28wks  Glucose Screen  2 hr  79/130/94  GBS neg  Feed Preference breast  Contraception BTL, consent:10/21/16   [facility]  Child[facility] Classes declined  [facility]    OB History    Gravida Para Term Preterm AB Living   2 1 1     1    SAB TAB Ectopic Multiple Live Births           1     Past Medical History:  Diagnosis Date  . Hypertension    Past Surgical History:  Procedure Laterality Date  . TONSILLECTOMY     Family History: family history includes Cancer in her maternal grandmother; Diabetes in her maternal grandmother and mother; Hypertension in her father and mother; Kidney disease in her maternal grandmother and mother. Social History:  reports that she has never smoked. She has never used smokeless tobacco. She reports that she does not drink alcohol or use drugs.     Maternal Diabetes: No Genetic Screening: Normal Maternal Ultrasounds/Referrals: Normal Fetal Ultrasounds or other Referrals:  None Maternal Substance Abuse:  No Significant Maternal Medications:  None Significant Maternal Lab Results:  None Other Comments:  None  Review of Systems  Gastrointestinal: Blood in stool:     History Exam by:: Dr Omer JackMumaw Blood pressure (!) 144/77, pulse 70, temperature 98.1 F (36.7 C), temperature source Oral, resp. rate 20, height 5\' 3"  (1.6 m), weight 281 lb (127.5 kg), last menstrual period 04/03/2016, SpO2 98  %. Exam Physical Exam  Prenatal labs: ABO, Rh: O/Positive/-- (11/01 0932) Antibody: Negative (02/21 0852) Rubella: 18.70 (11/01 0932) RPR: Non Reactive (02/21 0852)  HBsAg: Negative (11/01 0932)  HIV: Non Reactive (02/21 0852)  GBS: Negative (05/02 0000)   Assessment/Plan: IUP at term. SROM. GBS neg.  Offered patient expectant management or pitocin and patient desires pitocin.   Plan: Admit to labor and delivery. Pitocin for labor augmentation. Plans epidural for pain management. Anticipate NSVD.    Cleone SlimCaroline Neill SNM 12/29/2016, 2:39 AM   I confirm that I have verified the information documented in the nurse midwife student's note and that I have also personally reperformed the physical exam and all medical decision making activities.   Thressa ShellerHeather Jerrie Schussler 2:51 AM 12/29/16

## 2016-12-29 NOTE — Progress Notes (Signed)
Delivery of lvie viable female by Dr Mosetta PuttFeng, assisted by Dr Ashok PallWouk. Dr Adrian BlackwaterStinson assisted with repair. APGARS 9,9

## 2016-12-29 NOTE — Op Note (Signed)
Kari Maddox 12/29/2016  PREOPERATIVE DIAGNOSIS:  Undesired fertility  POSTOPERATIVE DIAGNOSIS:  Undesired fertility  PROCEDURE:  Postpartum Bilateral Tubal Sterilization using Filshie Clips   SURGEON:  Dr Candelaria CelesteJacob Stinson and Dr. Shonna ChockNoah Christina Waldrop (assisting)  ANESTHESIA:  Epidural  COMPLICATIONS:  None immediate.  ESTIMATED BLOOD LOSS:  Less than 20cc.   INDICATIONS: 31 y.o. yo G2P2002  with undesired fertility,status post vaginal delivery, desires permanent sterilization. Risks and benefits of procedure discussed with patient including permanence of method, bleeding, infection, injury to surrounding organs and need for additional procedures. Risk failure of 0.5-1% with increased risk of ectopic gestation if pregnancy occurs was also discussed with patient.   FINDINGS:  Normal uterus, tubes, and ovaries.  TECHNIQUE:  The patient was taken to the operating room where her epidural anesthesia was dosed up to surgical level and found to be adequate.  She was then placed in the dorsal supine position and prepped and draped in sterile fashion.  After an adequate timeout was performed, attention was turned to the patient's abdomen where a small transverse skin incision was made under the umbilical fold. The incision was taken down to the layer of fascia using the scalpel, and fascia was incised, and extended bilaterally using Mayo scissors. The peritoneum was entered in a sharp fashion. Attention was then turned to the patient's uterus, and right fallopian tube was identified and followed out to the fimbriated end.  A Filshie clip was placed on the right fallopian tube about 2 cm from the cornual attachment, with care given to incorporate the underlying mesosalpinx.  A similar process was carried out on the left side allowing for bilateral tubal sterilization.  Good hemostasis was noted overall.  30 ml 0.25% marcaine was injected subcutaneously at the incision site. The instruments were then removed from the  patient's abdomen and the fascial incision was repaired with 0 Vicryl, and the skin was closed with a 4-0 Monocryl subcuticular stitch. The patient tolerated the procedure well.  Sponge, lap, and needle counts were correct times two.  The patient was then taken to the recovery room awake, extubated and in stable condition.   Kathrynn RunningWouk, Whitman Meinhardt Bedford, MD 12/29/2016 1:42 PM

## 2016-12-29 NOTE — Anesthesia Postprocedure Evaluation (Signed)
Anesthesia Post Note  Patient: Kari Maddox  Procedure(s) Performed: Procedure(s) (LRB): POST PARTUM TUBAL LIGATION (N/A)  Patient location during evaluation: Mother Baby Anesthesia Type: Epidural Level of consciousness: awake and alert and patient cooperative Pain management: pain level controlled Vital Signs Assessment: post-procedure vital signs reviewed and stable Respiratory status: spontaneous breathing Cardiovascular status: stable Postop Assessment: no headache, no backache, epidural receding, patient able to bend at knees, no signs of nausea or vomiting and adequate PO intake Anesthetic complications: no        Last Vitals:  Vitals:   12/29/16 1545 12/29/16 1645  BP: 131/64 117/64  Pulse: 70 78  Resp: 20 18  Temp: 36.6 C 36.8 C    Last Pain:  Vitals:   12/29/16 1645  TempSrc: Oral  PainSc:    Pain Goal:                 Kari Maddox

## 2016-12-29 NOTE — MAU Provider Note (Signed)
Performed a brief bedside ultrasound. Baby is vertex presentation. Admitted to L&D for gross ROM and labor.  Cleda ClarksElizabeth W. Mumaw, DO  [facility]

## 2016-12-30 ENCOUNTER — Encounter: Payer: BLUE CROSS/BLUE SHIELD | Admitting: Obstetrics and Gynecology

## 2016-12-30 ENCOUNTER — Encounter: Payer: Self-pay | Admitting: *Deleted

## 2016-12-30 ENCOUNTER — Encounter (HOSPITAL_COMMUNITY): Payer: Self-pay

## 2016-12-30 DIAGNOSIS — Z302 Encounter for sterilization: Secondary | ICD-10-CM

## 2016-12-30 MED ORDER — OXYCODONE-ACETAMINOPHEN 5-325 MG PO TABS: 1.0000 | ORAL_TABLET | Freq: Four times a day (QID) | ORAL | 0 refills | Status: DC | PRN

## 2016-12-30 MED ORDER — SENNOSIDES-DOCUSATE SODIUM 8.6-50 MG PO TABS: 1.0000 | ORAL_TABLET | Freq: Every evening | ORAL | 0 refills | Status: DC | PRN

## 2016-12-30 MED ORDER — IBUPROFEN 600 MG PO TABS: 600.0000 mg | ORAL_TABLET | Freq: Four times a day (QID) | ORAL | 0 refills | Status: DC | PRN

## 2016-12-30 NOTE — Discharge Instructions (Signed)

## 2016-12-30 NOTE — Discharge Summary (Signed)
OB Discharge Summary     Patient Name: Kari Maddox DOB: Dec 28, 1985 MRN: [id number]  Date of admission: 12/29/2016 Delivering MD: Howard Pouch   Date of discharge: 12/30/2016  Admitting diagnosis: 39 WEEKS CTX ROM PP BTL Intrauterine pregnancy: [redacted]w[redacted]d     Secondary diagnosis:  Active Problems:   Normal labor   NSVD (normal spontaneous vaginal delivery)   Status post tubal ligation at time of delivery, current hosp  Additional problems: Patient Active Problem List   Diagnosis Date Noted  . NSVD (normal spontaneous vaginal delivery) 12/30/2016  . Status post tubal ligation at time of delivery, current hosp 12/30/2016  . Normal labor 12/29/2016  . UTI (urinary tract infection) during pregnancy, second trimester 09/02/2016  . Supervision of normal pregnancy 06/10/2016  . SUI (stress urinary incontinence, female) 11/13/2015  . Obesity 11/13/2015  . History of kidney stones 12/12/2012        Discharge diagnosis: Term Pregnancy Delivered                                                                                                Post partum procedures:postpartum tubal ligation  Augmentation: Pitocin  Complications: None  Hospital course:  Onset of Labor With Vaginal Delivery     31 y.o. yo [id number] at [redacted]w[redacted]d was admitted in Active Labor on 12/29/2016. Patient had an uncomplicated labor course as follows:  Membrane Rupture Time/Date: 10:00 PM ,12/28/2016   Intrapartum Procedures: Episiotomy: None [1]                                         Lacerations:  2nd degree [3];Perineal [11]  Patient had a delivery of a Viable infant. 12/29/2016  Information for the patient's newborn:  Airica, Schwartzkopf [[id number]]  Delivery Method: Vaginal, Spontaneous Delivery (Filed from Delivery Summary)    Pateint had an uncomplicated postpartum course.  She is ambulating, tolerating a regular diet, passing flatus, and urinating well. Patient is discharged home in stable condition on  12/30/16.   Physical exam  Vitals:   12/29/16 1645 12/29/16 2100 12/30/16 0045 12/30/16 0447  BP: 117/64 132/63 123/67 (!) 114/50  Pulse: 78 (!) 59 66 65  Resp: 18 18 18 18   Temp: 98.2 F (36.8 C) 98.8 F (37.1 C) 98.5 F (36.9 C) 98.4 F (36.9 C)  TempSrc: Oral Oral Oral Oral  SpO2: 98%     Weight:      Height:       General: alert, cooperative and no distress Lochia: appropriate Uterine Fundus: firm Incision: Healing well with no significant drainage, No significant erythema, Dressing is clean, dry, and intact DVT Evaluation: No evidence of DVT seen on physical exam. Labs: Lab Results  Component Value Date   WBC 9.6 12/29/2016   HGB 10.5 (L) 12/29/2016   HCT 30.4 (L) 12/29/2016   MCV 91.3 12/29/2016   PLT 225 12/29/2016   CMP Latest Ref Rng & Units 10/04/2016  Glucose 65 - 99 mg/dL 130(Q)  BUN 6 - 20  mg/dL 6  Creatinine 8.460.44 - 9.621.00 mg/dL 9.520.60  Sodium 841135 - 324145 mmol/L 135  Potassium 3.5 - 5.1 mmol/L 3.7  Chloride 101 - 111 mmol/L 107  CO2 22 - 32 mmol/L 22  Calcium 8.9 - 10.3 mg/dL 4.0(N8.7(L)  Total Protein 6.5 - 8.1 g/dL 5.9(L)  Total Bilirubin 0.3 - 1.2 mg/dL 0.3  Alkaline Phos 38 - 126 U/L 69  AST 15 - 41 U/L 14(L)  ALT 14 - 54 U/L 9(L)    Discharge instruction: per After Visit Summary and "Baby and Me Booklet".  After visit meds:  Allergies as of 12/30/2016   No Known Allergies     Medication List    STOP taking these medications   ondansetron 4 MG tablet Commonly known as:  ZOFRAN     TAKE these medications   acetaminophen 500 MG tablet Commonly known as:  TYLENOL Take 1,000 mg by mouth every 6 (six) hours as needed for moderate pain.   ibuprofen 600 MG tablet Commonly known as:  ADVIL,MOTRIN Take 1 tablet (600 mg total) by mouth every 6 (six) hours as needed for mild pain, moderate pain or cramping.   oxyCODONE-acetaminophen 5-325 MG tablet Commonly known as:  PERCOCET/ROXICET Take 1-2 tablets by mouth every 6 (six) hours as needed.    PRENATAL VITAMIN PO Take 1 tablet by mouth daily.   senna-docusate 8.6-50 MG tablet Commonly known as:  Senokot-S Take 1 tablet by mouth at bedtime as needed for mild constipation.       Diet: routine diet  Activity: Advance as tolerated. Pelvic rest for 6 weeks.   Outpatient follow up:6 weeks Follow up Appt:No future appointments. Follow up Visit:No Follow-up on file.  Postpartum contraception: Tubal Ligation  Newborn Data: Live born female  Birth Weight: 8 lb 4 oz (3742 g) APGAR: 9, 9  Baby Feeding: Bottle Disposition:home with mother   12/30/2016 Sharen CounterLisa Leftwich-Kirby, CNM

## 2017-01-01 ENCOUNTER — Encounter (HOSPITAL_COMMUNITY): Payer: Self-pay | Admitting: Family Medicine

## 2017-01-01 NOTE — Anesthesia Postprocedure Evaluation (Signed)
Anesthesia Post Note  Patient: Kari Maddox  Procedure(s) Performed: * No procedures listed *  Patient location during evaluation: Mother Baby Anesthesia Type: Epidural Level of consciousness: awake and alert Pain management: pain level controlled Vital Signs Assessment: post-procedure vital signs reviewed and stable Respiratory status: spontaneous breathing, nonlabored ventilation and respiratory function stable Cardiovascular status: stable Postop Assessment: no headache, no backache and epidural receding Anesthetic complications: no       Last Vitals: There were no vitals filed for this visit.  Last Pain: There were no vitals filed for this visit.               Yola Paradiso S

## 2017-01-14 ENCOUNTER — Encounter: Payer: Self-pay | Admitting: Advanced Practice Midwife

## 2017-02-04 ENCOUNTER — Ambulatory Visit (INDEPENDENT_AMBULATORY_CARE_PROVIDER_SITE_OTHER): Payer: BLUE CROSS/BLUE SHIELD | Admitting: Advanced Practice Midwife

## 2017-02-04 ENCOUNTER — Encounter: Payer: Self-pay | Admitting: Advanced Practice Midwife

## 2017-02-04 NOTE — Progress Notes (Signed)
Kari Maddox is a 31 y.o. who presents for a postpartum visit. She is 5 weeks postpartum following a spontaneous vaginal delivery. I have fully reviewed the prenatal and intrapartum course. The delivery was at 39.6 gestational weeks.  Anesthesia: none. Postpartum course has been uneventful. Baby's course has been uneventful. Baby is feeding by bottle. Bleeding: no bleeding. Bowel function is normal. Bladder function is normal. Patient is not sexually active. Contraception method is tubal ligation. Postpartum depression screening: negative.  No current outpatient prescriptions on file.  Review of Systems   Constitutional: Negative for fever and chills Eyes: Negative for visual disturbances Respiratory: Negative for shortness of breath, dyspnea Cardiovascular: Negative for chest pain or palpitations  Gastrointestinal: Negative for vomiting, diarrhea and constipation Genitourinary: Negative for dysuria and urgency Musculoskeletal: Negative for back pain, joint pain, myalgias  Neurological: Negative for dizziness and headaches    Objective:     Vitals:   02/04/17 1107  BP: 128/90  Pulse: 80   General:  alert, cooperative and no distress   Breasts:  negative  Lungs: clear to auscultation bilaterally  Heart:  regular rate and rhythm  Abdomen: Soft, nontender   Vulva:  normal  Vagina: normal vagina  Cervix:  closed  Corpus: Well involuted     Rectal Exam: no hemorrhoids        Assessment:    normal postpartum exam.  Plan:   1. Contraception: tubal ligation done at delivery 2. Follow up in:   or as needed.

## 2017-02-04 NOTE — Patient Instructions (Signed)
Next pap smear due MAY 2020

## 2017-02-19 NOTE — Addendum Note (Signed)
Addendum  created 02/19/17 1305 by Toiya Morrish, MD   Sign clinical note    

## 2017-02-19 NOTE — Anesthesia Postprocedure Evaluation (Signed)
Anesthesia Post Note  Patient: Kari Maddox  Procedure(s) Performed: Procedure(s) (LRB): POST PARTUM TUBAL LIGATION (N/A)     Anesthesia Post Evaluation  Last Vitals:  Vitals:   12/30/16 0045 12/30/16 0447  BP: 123/67 (!) 114/50  Pulse: 66 65  Resp: 18 18  Temp: 36.9 C 36.9 C    Last Pain:  Vitals:   12/30/16 0745  TempSrc:   PainSc: 3                  Amyra Vantuyl EDWARD

## 2017-03-08 DIAGNOSIS — Z029 Encounter for administrative examinations, unspecified: Secondary | ICD-10-CM

## 2017-06-11 ENCOUNTER — Ambulatory Visit (HOSPITAL_BASED_OUTPATIENT_CLINIC_OR_DEPARTMENT_OTHER)
Admission: RE | Admit: 2017-06-11 | Discharge: 2017-06-11 | Disposition: A | Discharge status: Home / Self Care | Payer: BLUE CROSS/BLUE SHIELD | Source: Ambulatory Visit | Attending: Internal Medicine | Admitting: Internal Medicine

## 2017-06-11 ENCOUNTER — Other Ambulatory Visit (HOSPITAL_BASED_OUTPATIENT_CLINIC_OR_DEPARTMENT_OTHER): Payer: Self-pay | Admitting: Internal Medicine

## 2017-06-11 DIAGNOSIS — N912 Amenorrhea, unspecified: Secondary | ICD-10-CM | POA: Diagnosis present

## 2017-07-07 ENCOUNTER — Other Ambulatory Visit: Payer: Self-pay | Admitting: Obstetrics and Gynecology

## 2019-03-24 IMAGING — US US PELVIS LIMITED
1 series · 13 of 25 positions shown · non-contrast
Comparison: Pelvic ultrasound 12/02/2016

CLINICAL DATA: Patient with amenorrhea.

EXAM:
TRANSABDOMINAL AND TRANSVAGINAL ULTRASOUND OF PELVIS
TECHNIQUE: Both transabdominal and transvaginal ultrasound examinations of the
pelvis were performed. Transabdominal technique was performed for
global imaging of the pelvis including uterus, ovaries, adnexal
regions, and pelvic cul-de-sac. It was necessary to proceed with
endovaginal exam following the transabdominal exam to visualize the
endometrium and adnexal structures.

[Series 1: us pelvis limited · 0.24mm/px · 13 of 42 slices shown]
[im 1/42]
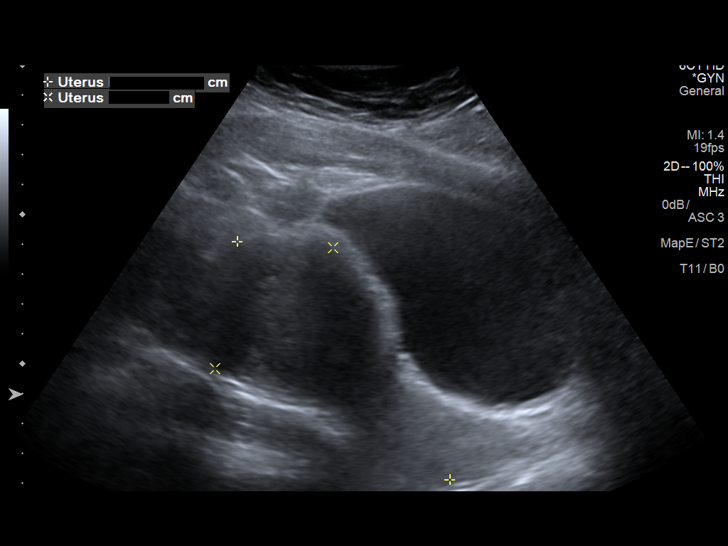
[im 4/42]
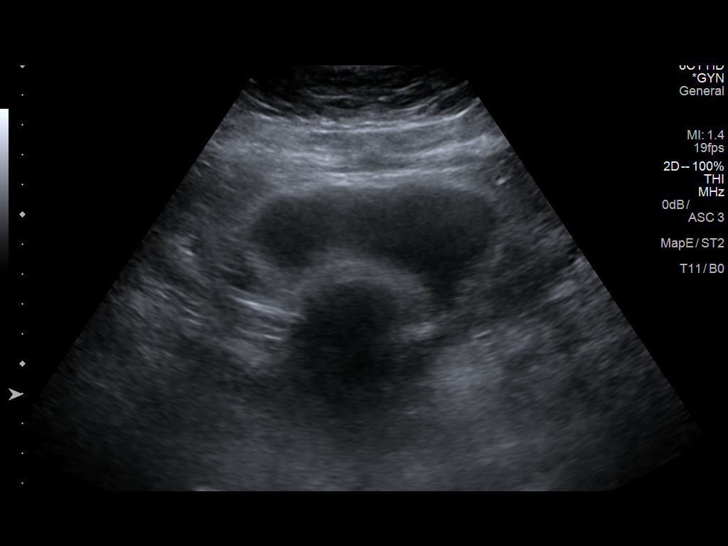
[im 7/42]
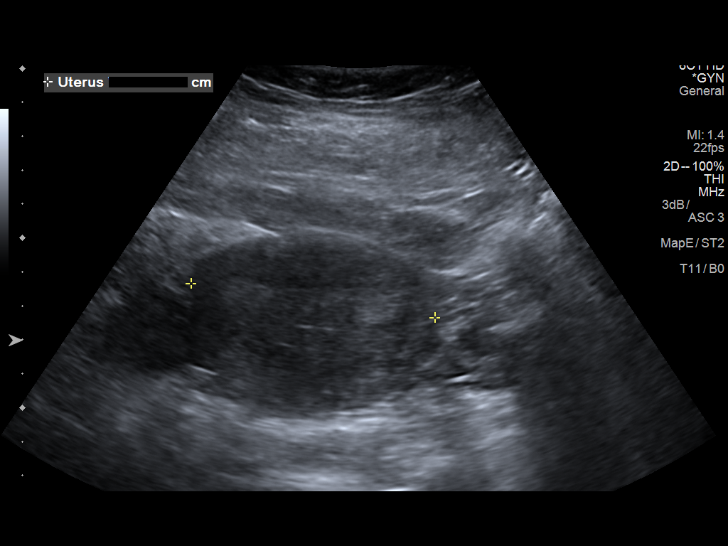
[im 11/42]
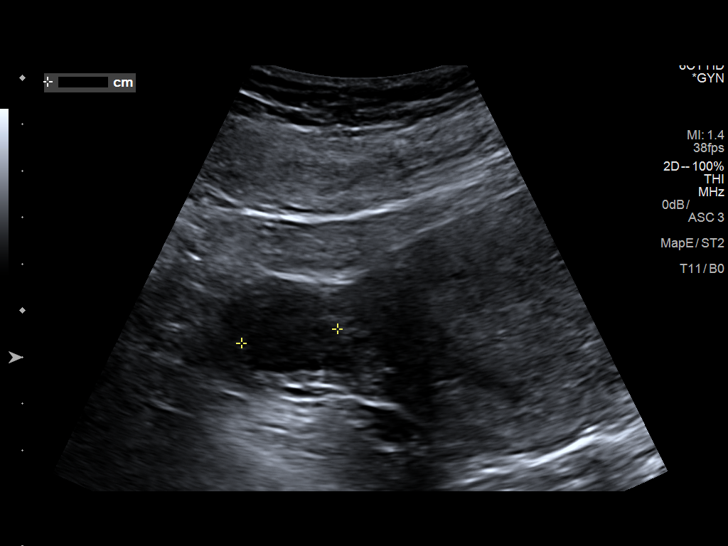
[im 14/42]
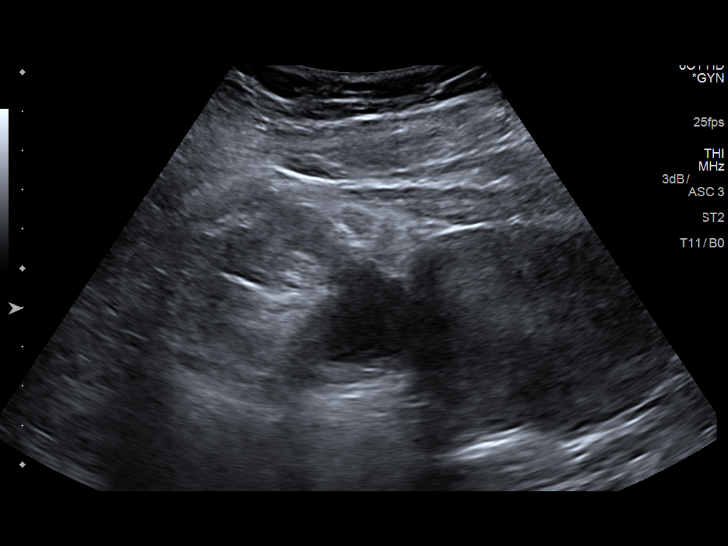
[im 18/42]
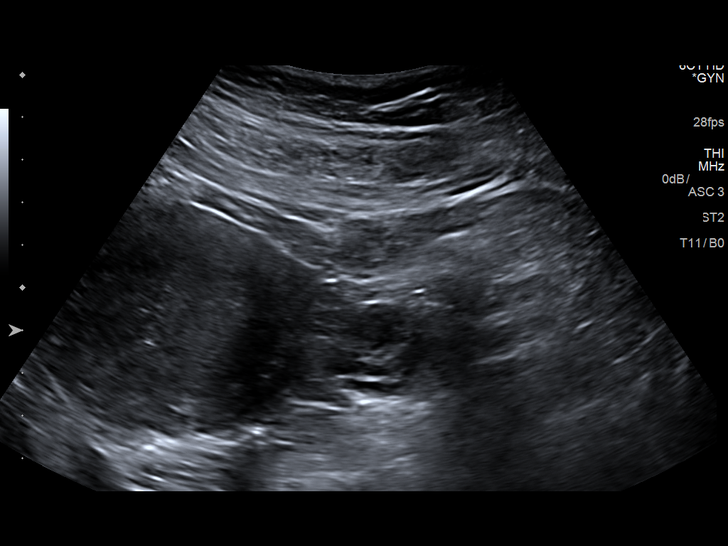
[im 21/42]
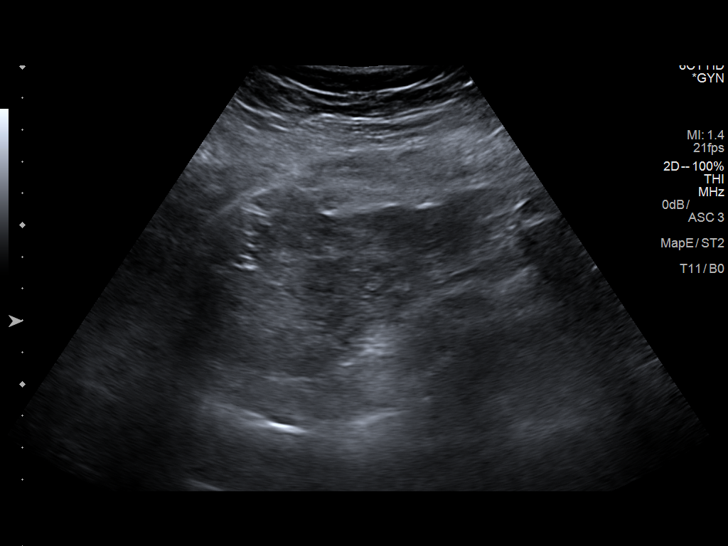
[im 24/42]
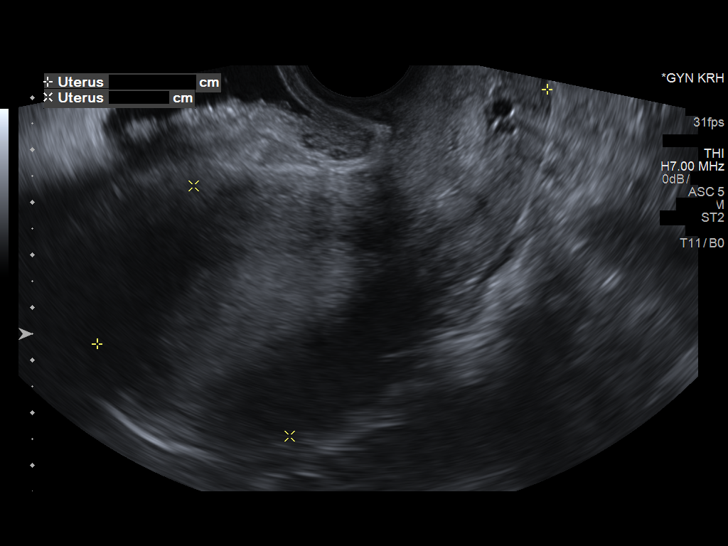
[im 28/42]
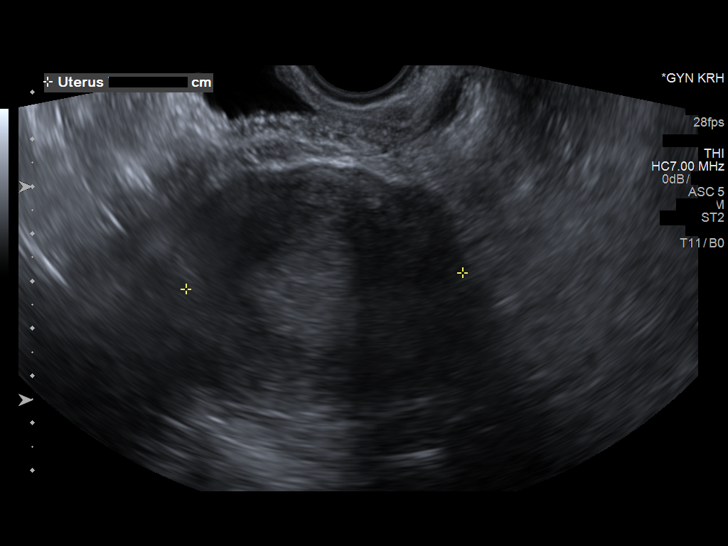
[im 31/42]
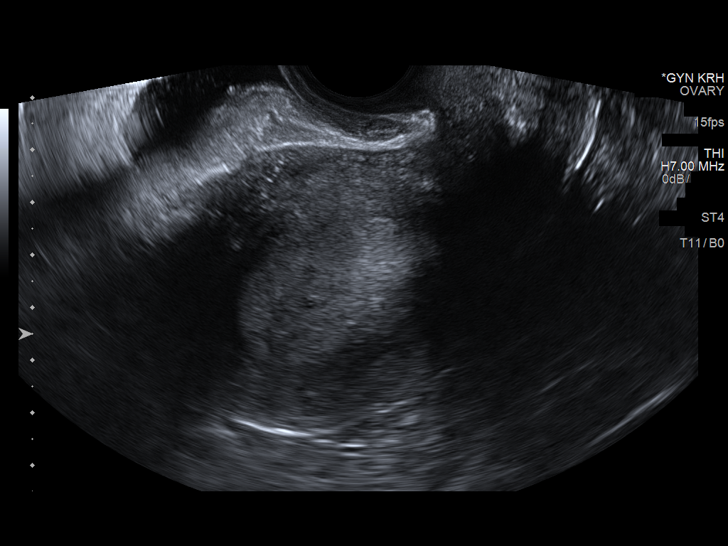
[im 35/42]
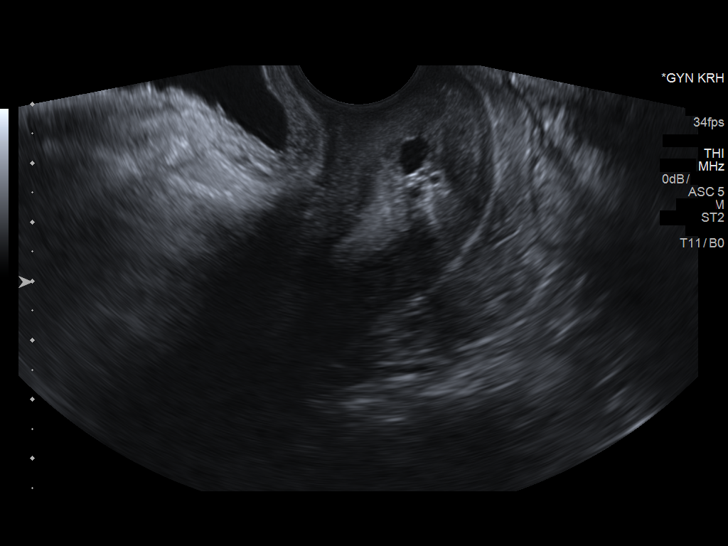
[im 38/42]
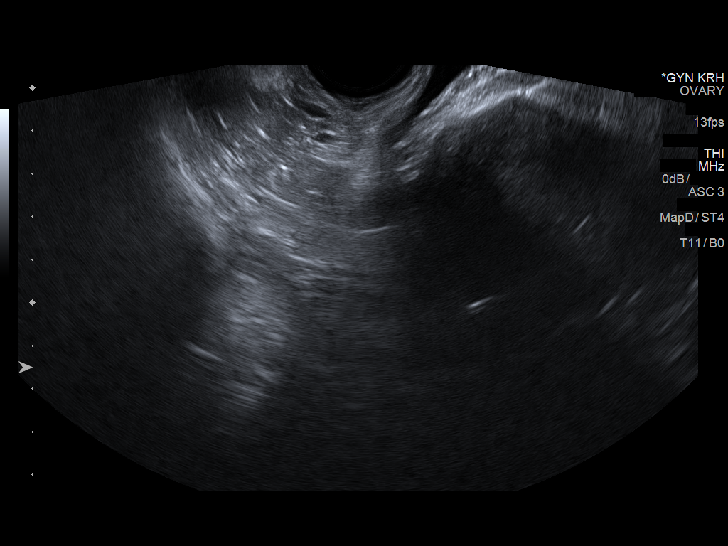
[im 42/42]
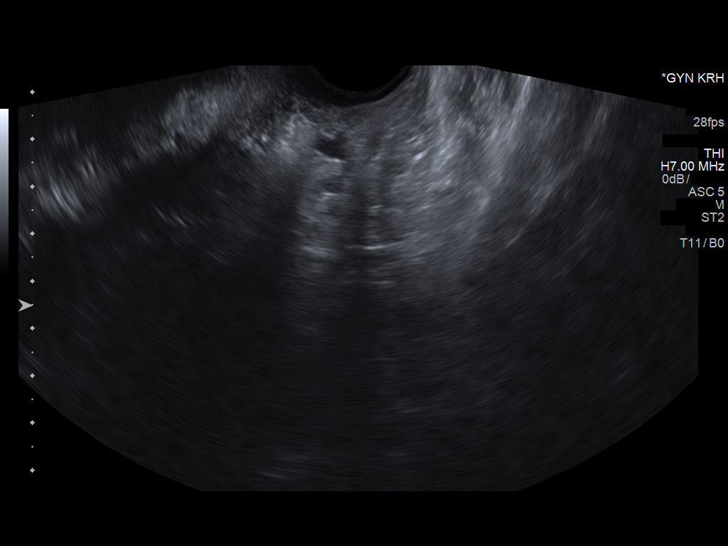

[13 of 25 positions shown; findings below may reference images not displayed]

FINDINGS: Uterus

Measurements: 9.8 x 5.1 x 5.9 cm. No fibroids or other mass
visualized.

Endometrium

Thickness: 22 mm.  Heterogeneous in echogenicity

Right ovary

Measurements: 3.4 x 2.2 x 3.4 cm. There is a 2.4 x 1.2 x 2.1 cm
hypoechoic area which is poorly visualized given transabdominal
technique, potentially representing a cyst.

Left ovary

Measurements: 2.5 x 1.9 x 3.0 cm. Normal appearance/no adnexal mass.

Other findings

No abnormal free fluid.
IMPRESSION: The endometrium is thickened measuring 22 mm. Endometrial thickness
is considered abnormal. Consider follow-up by US in 6-8 weeks,
during the week immediately following menses (exam timing is
critical).

Limited evaluation the ovaries given transabdominal technique.
Possible right ovarian cyst. Recommend attention on follow-up exam.

## 2023-05-17 ENCOUNTER — Other Ambulatory Visit: Payer: Self-pay

## 2023-05-17 ENCOUNTER — Emergency Department (HOSPITAL_BASED_OUTPATIENT_CLINIC_OR_DEPARTMENT_OTHER): Payer: BC Managed Care – PPO | Admitting: Radiology

## 2023-05-17 ENCOUNTER — Encounter (HOSPITAL_BASED_OUTPATIENT_CLINIC_OR_DEPARTMENT_OTHER): Payer: Self-pay | Admitting: Emergency Medicine

## 2023-05-17 ENCOUNTER — Emergency Department (HOSPITAL_BASED_OUTPATIENT_CLINIC_OR_DEPARTMENT_OTHER)
Admission: EM | Admit: 2023-05-17 | Discharge: 2023-05-17 | Disposition: A | Discharge status: Home / Self Care | Payer: BC Managed Care – PPO | Attending: Emergency Medicine | Admitting: Emergency Medicine

## 2023-05-17 DIAGNOSIS — Z1152 Encounter for screening for COVID-19: Secondary | ICD-10-CM | POA: Insufficient documentation

## 2023-05-17 DIAGNOSIS — J189 Pneumonia, unspecified organism: Secondary | ICD-10-CM | POA: Insufficient documentation

## 2023-05-17 DIAGNOSIS — R0602 Shortness of breath: Secondary | ICD-10-CM | POA: Diagnosis present

## 2023-05-17 LAB — RESP PANEL BY RT-PCR (RSV, FLU A&B, COVID)  RVPGX2
Influenza A by PCR: NEGATIVE
Influenza B by PCR: NEGATIVE
Resp Syncytial Virus by PCR: NEGATIVE
SARS Coronavirus 2 by RT PCR: NEGATIVE

## 2023-05-17 MED ORDER — HYDROCODONE-ACETAMINOPHEN 5-325 MG PO TABS
1.0000 | ORAL_TABLET | Freq: Once | ORAL | Status: AC
  Administered 2023-05-17: 1 via ORAL
  Filled 2023-05-17: qty 1

## 2023-05-17 MED ORDER — DOXYCYCLINE HYCLATE 100 MG PO TABS
100.0000 mg | ORAL_TABLET | Freq: Once | ORAL | Status: AC
  Administered 2023-05-17: 100 mg via ORAL
  Filled 2023-05-17: qty 1

## 2023-05-17 MED ORDER — BENZONATATE 100 MG PO CAPS: 100.0000 mg | ORAL_CAPSULE | Freq: Three times a day (TID) | ORAL | 0 refills | Status: AC

## 2023-05-17 MED ORDER — DOXYCYCLINE HYCLATE 100 MG PO CAPS: 100.0000 mg | ORAL_CAPSULE | Freq: Two times a day (BID) | ORAL | 0 refills | Status: AC

## 2023-05-17 NOTE — ED Provider Notes (Signed)
[place] EMERGENCY DEPARTMENT AT [facility] Provider Note   CSN: [id number] Arrival date & time: 05/17/23  1518     History  Chief Complaint  Patient presents with   Shortness of Breath    Kari Maddox is a 37 y.o. female.  HPI   37 year old female presents emergency department with productive cough and shortness of breath for the past 3 days.  Also endorsing body aches and chills.  Took a home COVID test that was negative.  Presents here with ongoing symptoms.  Denies any lower leg swelling, hemoptysis.  Home Medications Prior to Admission medications   Medication Sig Start Date End Date Taking? Authorizing Provider  benzonatate (TESSALON) 100 MG capsule Take 1 capsule (100 mg total) by mouth every 8 (eight) hours. 05/17/23  Yes Kaicen Desena, Clabe Seal, DO  doxycycline (VIBRAMYCIN) 100 MG capsule Take 1 capsule (100 mg total) by mouth 2 (two) times daily. 05/17/23  Yes Aviendha Azbell, Clabe Seal, DO      Allergies    Patient has no known allergies.    Review of Systems   Review of Systems  Constitutional:  Positive for appetite change, chills and fatigue. Negative for fever.  Respiratory:  Positive for cough, chest tightness and shortness of breath.   Cardiovascular:  Negative for chest pain and leg swelling.  Gastrointestinal:  Negative for abdominal pain, diarrhea and vomiting.  Skin:  Negative for rash.  Neurological:  Negative for headaches.    Physical Exam Updated Vital Signs BP (!) 187/103   Pulse 93   Temp 99.3 F (37.4 C) (Oral)   Resp 20   Wt 129.3 kg   SpO2 96%   BMI 50.49 kg/m  Physical Exam Vitals and nursing note reviewed.  Constitutional:      General: She is not in acute distress.    Appearance: Normal appearance.  HENT:     Head: Normocephalic.     Mouth/Throat:     Mouth: Mucous membranes are moist.  Cardiovascular:     Rate and Rhythm: Normal rate.  Pulmonary:     Effort: Pulmonary effort is normal. No tachypnea or respiratory distress.      Breath sounds: Examination of the right-lower field reveals decreased breath sounds. Examination of the left-lower field reveals decreased breath sounds. Decreased breath sounds present.  Chest:     Chest wall: No tenderness or crepitus.  Abdominal:     Palpations: Abdomen is soft.     Tenderness: There is no abdominal tenderness.  Musculoskeletal:     Right lower leg: No edema.     Left lower leg: No edema.  Skin:    General: Skin is warm.  Neurological:     Mental Status: She is alert and oriented to person, place, and time. Mental status is at baseline.  Psychiatric:        Mood and Affect: Mood normal.     ED Results / Procedures / Treatments   Labs (all labs ordered are listed, but only abnormal results are displayed) Labs Reviewed  RESP PANEL BY RT-PCR (RSV, FLU A&B, COVID)  [id number]    EKG EKG Interpretation Date/Time:  Monday May 17 2023 15:38:35 EDT Ventricular Rate:  82 PR Interval:  154 QRS Duration:  82 QT Interval:  378 QTC Calculation: 441 R Axis:   52  Text Interpretation: Normal sinus rhythm Possible Anterior infarct , age undetermined Abnormal ECG No previous ECGs available Confirmed by Coralee Pesa [phone]) on 05/17/2023 8:36:31 PM  Radiology DG Chest  2 View  Result Date: 05/17/2023 CLINICAL DATA:  Shortness of breath.  Cough. EXAM: CHEST - 2 VIEW COMPARISON:  None Available. FINDINGS: There are heterogeneous patchy opacities in the right lung, concerning for multilobar pneumonia. Follow-up to clearing is recommended. Left lung is clear. No dense consolidation or lung collapse. Bilateral costophrenic angles are clear. Normal cardio-mediastinal silhouette. No acute osseous abnormalities. The soft tissues are within normal limits. IMPRESSION: 1. Heterogeneous patchy opacities in the right lung, concerning for multilobar pneumonia. Follow-up to clearing is recommended. Electronically Signed   By: Jules Schick M.D.   On: 05/17/2023 16:23     Procedures Procedures    Medications Ordered in ED Medications  doxycycline (VIBRA-TABS) tablet 100 mg (100 mg Oral Given 05/17/23 2049)  HYDROcodone-acetaminophen (NORCO/VICODIN) 5-325 MG per tablet 1 tablet (1 tablet Oral Given 05/17/23 2049)    ED Course/ Medical Decision Making/ A&P                                 Medical Decision Making Amount and/or Complexity of Data Reviewed Radiology: ordered.  Risk Prescription drug management.   37 year old female presents emergency department with ongoing productive cough, body aches, chills.  Vitals are normal stable on arrival.  Respiratory panel is negative.  EKG shows no acute ischemic changes.  She has no hypoxia or tachycardia.  Chest x-ray identifies patchy infiltrates in the right lung fields consistent with a pneumonia.  Will treat with antibiotics and symptom control.  Patient tolerated p.o. medicine.  Patient at this time appears safe and stable for discharge and close outpatient follow up. Discharge plan and strict return to ED precautions discussed, patient verbalizes understanding and agreement.        Final Clinical Impression(s) / ED Diagnoses Final diagnoses:  Pneumonia of right lung due to infectious organism, unspecified part of lung    Rx / DC Orders ED Discharge Orders          Ordered    doxycycline (VIBRAMYCIN) 100 MG capsule  2 times daily        05/17/23 2035    benzonatate (TESSALON) 100 MG capsule  Every 8 hours        05/17/23 2035              Rozelle Logan, DO 05/17/23 2107

## 2023-05-17 NOTE — Discharge Instructions (Signed)
You have been seen and discharged from the emergency department.  Your viral respiratory panel is negative.  But your chest x-ray shows a right-sided pneumonia.  Take antibiotics as directed.  Use Tylenol/ibuprofen as needed for fever/pain control.  Use cough medicine as needed.  Stay well-hydrated.  Follow-up with your primary provider for further evaluation and further care. Take home medications as prescribed. If you have any worsening symptoms or further concerns for your health please return to an emergency department for further evaluation.

## 2023-05-17 NOTE — ED Triage Notes (Signed)
Pt c/o shob, cough x 3 days with gen. Body aches. Home covid neg
